# Patient Record
Sex: Female | Born: 1990 | Hispanic: No | Marital: Single | Smoking: Never smoker
Health system: Southern US, Community
[De-identification: ages and names within clinical notes are randomized; demographics above are authoritative.]

## PROBLEM LIST (undated history)

## (undated) ENCOUNTER — Inpatient Hospital Stay (HOSPITAL_COMMUNITY): Payer: Self-pay

## (undated) DIAGNOSIS — I82409 Acute embolism and thrombosis of unspecified deep veins of unspecified lower extremity: Secondary | ICD-10-CM

## (undated) HISTORY — DX: Acute embolism and thrombosis of unspecified deep veins of unspecified lower extremity: I82.409

---

## 2009-12-22 ENCOUNTER — Ambulatory Visit (HOSPITAL_COMMUNITY): Admission: RE | Admit: 2009-12-22 | Discharge: 2009-12-22 | Payer: Self-pay | Admitting: Obstetrics & Gynecology

## 2010-05-04 ENCOUNTER — Inpatient Hospital Stay (HOSPITAL_COMMUNITY)
Admission: AD | Admit: 2010-05-04 | Discharge: 2010-05-04 | Payer: Self-pay | Source: Home / Self Care | Admitting: Obstetrics & Gynecology

## 2010-05-04 ENCOUNTER — Ambulatory Visit: Payer: Self-pay | Admitting: Advanced Practice Midwife

## 2010-05-15 ENCOUNTER — Ambulatory Visit: Payer: Self-pay | Admitting: Obstetrics and Gynecology

## 2010-05-15 ENCOUNTER — Inpatient Hospital Stay (HOSPITAL_COMMUNITY)
Admission: AD | Admit: 2010-05-15 | Discharge: 2010-05-17 | Payer: Self-pay | Source: Home / Self Care | Admitting: Obstetrics and Gynecology

## 2011-03-19 LAB — CBC
Hemoglobin: 10.1 g/dL — ABNORMAL LOW (ref 12.0–15.0)
RBC: 3.78 MIL/uL — ABNORMAL LOW (ref 3.87–5.11)
WBC: 13.2 10*3/uL — ABNORMAL HIGH (ref 4.0–10.5)

## 2011-03-19 LAB — RPR: RPR Ser Ql: NONREACTIVE

## 2015-07-08 LAB — OB RESULTS CONSOLE HGB/HCT, BLOOD
HEMATOCRIT: 38 %
HEMOGLOBIN: 12.5 g/dL

## 2015-07-18 ENCOUNTER — Encounter: Payer: Self-pay | Admitting: *Deleted

## 2015-07-25 ENCOUNTER — Encounter: Payer: Self-pay | Admitting: Obstetrics and Gynecology

## 2015-07-25 ENCOUNTER — Other Ambulatory Visit (HOSPITAL_COMMUNITY)
Admission: RE | Admit: 2015-07-25 | Discharge: 2015-07-25 | Disposition: A | Discharge status: Home / Self Care | Payer: BLUE CROSS/BLUE SHIELD | Source: Ambulatory Visit | Attending: Obstetrics and Gynecology | Admitting: Obstetrics and Gynecology

## 2015-07-25 ENCOUNTER — Ambulatory Visit (INDEPENDENT_AMBULATORY_CARE_PROVIDER_SITE_OTHER): Payer: BLUE CROSS/BLUE SHIELD | Admitting: Obstetrics and Gynecology

## 2015-07-25 VITALS — BP 100/60 | Ht 67.5 in

## 2015-07-25 DIAGNOSIS — Z113 Encounter for screening for infections with a predominantly sexual mode of transmission: Secondary | ICD-10-CM | POA: Insufficient documentation

## 2015-07-25 DIAGNOSIS — Z01419 Encounter for gynecological examination (general) (routine) without abnormal findings: Secondary | ICD-10-CM | POA: Diagnosis not present

## 2015-07-25 MED ORDER — MEGESTROL ACETATE 40 MG PO TABS: 40.0000 mg | ORAL_TABLET | Freq: Three times a day (TID) | ORAL | Status: DC

## 2015-07-25 NOTE — Progress Notes (Signed)
Patient ID: Anne Zuercher, female   DOB: Sep 27, 1991, 24 y.o.   MRN: [id number]    [facility] Visit  Patient name: Anne Kirk MRN [id number]  Date of birth: 12-26-1991  CC & HPI:  Anne Kirk is a 24 y.o. female presenting today for annual physical exam and pap smear. She has no complaints. Pt is not on birth control. She plans to use Nexplanon for birth control.  Pt was also referred to GYN by [facility] for irregular vaginal bleeding that started 1 month ago. Pt was on the depo shot for 2 years, but ceased birth control 6-7 months prior to the onset of symptoms. She notes regular MP after she stopped depo and before the irregular bleeding occurred. Pt was seen by [facility] for the same and had normal UA and lab work.   ROS:  A complete 10 system review of systems was obtained and all systems are negative except as noted in the HPI and PMH.   Pertinent History Reviewed:   Reviewed. Medical        History reviewed. No pertinent past medical history.                            Surgical Hx:   History reviewed. No pertinent past surgical history. Medications: Reviewed & Updated - see associated section                      No current outpatient prescriptions on file.   Social History: Reviewed -  reports that she has never smoked. She has never used smokeless tobacco.  Objective:  BP 100/60 mmHg  Ht 5' 7.5" (1.715 m)  LMP 06/22/2015   BMI: There is no weight on file to calculate BMI.   Annual Physical Exam and Pap Smear  General Appearance: Alert, appropriate appearance for age. No acute distress HEENT: Grossly normal Neck / Thyroid:  Cardiovascular: RRR; normal S1, S2, no murmur Lungs: CTA bilaterally Back: No CVAT Breast Exam: No dimpling, nipple retraction or discharge. No masses or nodes., Normal to inspection, Normal breast tissue bilaterally and No masses or nodes.No dimpling, nipple retraction or  discharge. Gastrointestinal: Soft, non-tender, no masses or organomegaly Pelvic Exam: External genitalia: normal general appearance Vaginal: normal mucosa without prolapse or lesions and normal without tenderness, induration or masses Cervix: normal appearance Adnexa: normal bimanual exam Uterus: normal single, nontender and tiny Rectovaginal: not indicated Lymphatic Exam: Non-palpable nodes in neck, clavicular, axillary, or inguinal regions  Skin: no rash or abnormalities Neurologic: Normal gait and speech, no tremor  Psychiatric: Alert and oriented, appropriate affect.  12:23 PM Pap Smear Collected  Assessment & Plan:   A:  1. Normal physical exam; pap smear done 2. Irregular bleeding; suspect secondary to stopping Provera  P:  1. pap smear done, next pap due in 3 years 2. return annually or prn 3    Annual mammogram advised 4. Prescribe Megace 40 tid til stops5. Pt to return in 4 weeks for Nexplanon; discussed abstinence and/or contraceptive use for 2 weeks prior to insertion  This chart was scribed for Tilda Burrow, MD by Gwenyth Ober, ED Scribe. This patient was seen in room 1 and the patient's care was started at 12:12 PM.   I personally performed the services described in this documentation, which was SCRIBED in my presence. The recorded information has been reviewed and considered accurate. It has been edited as  necessary during review. Tilda Burrow, MD

## 2015-07-25 NOTE — Progress Notes (Signed)
Patient ID: Anne Kirk, female   DOB: [birth year], 24 y.o.   MRN: [id number] Pt here today for annual exam. Pt states that she has had some abnormal bleeding since 06/22/15. Pt has never had this before. And wants to discuss with Dr. Emelda Fear.

## 2015-07-26 LAB — CYTOLOGY - PAP

## 2015-08-24 ENCOUNTER — Encounter: Payer: BLUE CROSS/BLUE SHIELD | Admitting: Advanced Practice Midwife

## 2015-08-31 ENCOUNTER — Encounter: Payer: BLUE CROSS/BLUE SHIELD | Admitting: Advanced Practice Midwife

## 2015-08-31 ENCOUNTER — Encounter: Payer: Self-pay | Admitting: Advanced Practice Midwife

## 2020-12-14 ENCOUNTER — Other Ambulatory Visit: Payer: Self-pay

## 2020-12-14 ENCOUNTER — Encounter (HOSPITAL_COMMUNITY): Payer: Self-pay | Admitting: Emergency Medicine

## 2020-12-14 ENCOUNTER — Emergency Department (HOSPITAL_COMMUNITY): Payer: Commercial Managed Care - PPO

## 2020-12-14 ENCOUNTER — Emergency Department (HOSPITAL_COMMUNITY)
Admission: EM | Admit: 2020-12-14 | Discharge: 2020-12-14 | Disposition: A | Discharge status: Home / Self Care | Payer: Commercial Managed Care - PPO | Attending: Emergency Medicine | Admitting: Emergency Medicine

## 2020-12-14 DIAGNOSIS — R600 Localized edema: Secondary | ICD-10-CM

## 2020-12-14 DIAGNOSIS — R2232 Localized swelling, mass and lump, left upper limb: Secondary | ICD-10-CM | POA: Diagnosis present

## 2020-12-14 DIAGNOSIS — I82622 Acute embolism and thrombosis of deep veins of left upper extremity: Secondary | ICD-10-CM

## 2020-12-14 LAB — BASIC METABOLIC PANEL
Anion gap: 9 (ref 5–15)
BUN: 11 mg/dL (ref 6–20)
CO2: 25 mmol/L (ref 22–32)
Calcium: 9.2 mg/dL (ref 8.9–10.3)
Chloride: 101 mmol/L (ref 98–111)
Creatinine, Ser: 0.57 mg/dL (ref 0.44–1.00)
GFR, Estimated: >60 mL/min (ref >60)
Glucose, Bld: 98 mg/dL (ref 70–99)
Potassium: 3.6 mmol/L (ref 3.5–5.1)
Sodium: 135 mmol/L (ref 135–145)

## 2020-12-14 LAB — CBC WITH DIFFERENTIAL/PLATELET
Abs Immature Granulocytes: 0.05 10*3/uL (ref 0.00–0.07)
Basophils Absolute: 0.1 10*3/uL (ref 0.0–0.1)
Basophils Relative: 0 %
Eosinophils Absolute: 0.6 10*3/uL — ABNORMAL HIGH (ref 0.0–0.5)
Eosinophils Relative: 4 %
HCT: 36.7 % (ref 36.0–46.0)
Hemoglobin: 11.8 g/dL — ABNORMAL LOW (ref 12.0–15.0)
Immature Granulocytes: 0 %
Lymphocytes Relative: 19 %
Lymphs Abs: 3 10*3/uL (ref 0.7–4.0)
MCH: 27.6 pg (ref 26.0–34.0)
MCHC: 32.2 g/dL (ref 30.0–36.0)
MCV: 85.9 fL (ref 80.0–100.0)
Monocytes Absolute: 0.9 10*3/uL (ref 0.1–1.0)
Monocytes Relative: 6 %
Neutro Abs: 10.9 10*3/uL — ABNORMAL HIGH (ref 1.7–7.7)
Neutrophils Relative %: 71 %
Platelets: 496 10*3/uL — ABNORMAL HIGH (ref 150–400)
RBC: 4.27 MIL/uL (ref 3.87–5.11)
RDW: 12.3 % (ref 11.5–15.5)
WBC: 15.5 10*3/uL — ABNORMAL HIGH (ref 4.0–10.5)
nRBC: 0 % (ref 0.0–0.2)

## 2020-12-14 LAB — PROTIME-INR
INR: 1.1 (ref 0.8–1.2)
Prothrombin Time: 14 seconds (ref 11.4–15.2)

## 2020-12-14 LAB — POC URINE PREG, ED: Preg Test, Ur: NEGATIVE

## 2020-12-14 MED ORDER — RIVAROXABAN (XARELTO) VTE STARTER PACK (15 & 20 MG): ORAL_TABLET | ORAL | 0 refills | Status: DC

## 2020-12-14 MED ORDER — ACETAMINOPHEN 500 MG PO TABS
1000.0000 mg | ORAL_TABLET | Freq: Once | ORAL | Status: AC
  Administered 2020-12-14: 1000 mg via ORAL
  Filled 2020-12-14: qty 2

## 2020-12-14 NOTE — ED Provider Notes (Signed)
[facility] EMERGENCY DEPARTMENT Provider Note   CSN: [id number] Arrival date & time: 12/14/20  1024     History Chief Complaint  Patient presents with   Arm Injury    Anne Kirk is a 29 y.o. female.  Patient presents with persistent left arm pain upper and lower for the past week.  No injuries.  No history of similar, no history of blood clots.  Patient is on birth control.  No fevers chills or shortness of breath.  Patient has swelling to the left arm.        History reviewed. No pertinent past medical history.  There are no problems to display for this patient.   History reviewed. No pertinent surgical history.   OB History   No obstetric history on file.     Family History  Problem Relation Age of Onset   Hypertension Father    Cancer Maternal Grandmother    Diabetes Maternal Grandmother    Cancer Maternal Grandfather    Heart disease Maternal Grandfather    Asthma Son    Eczema Son    Hypertension Paternal Grandfather    Heart disease Paternal Grandfather     Social History   Tobacco Use   Smoking status: Never Smoker   Smokeless tobacco: Never Used  Building services engineer Use: Never used  Substance Use Topics   Alcohol use: No   Drug use: No    Home Medications Prior to Admission medications   Medication Sig Start Date End Date Taking? Authorizing Provider  megestrol (MEGACE) 40 MG tablet Take 1 tablet (40 mg total) by mouth 3 (three) times daily. Until bleeding stops then once taily til 7 days before next visit 07/25/15   Tilda Burrow, MD    Allergies    Patient has no known allergies.  Review of Systems   Review of Systems  Constitutional: Negative for chills and fever.  HENT: Negative for congestion.   Respiratory: Negative for shortness of breath.   Cardiovascular: Negative for chest pain.  Gastrointestinal: Negative for abdominal pain and vomiting.  Genitourinary: Negative for dysuria and flank pain.   Musculoskeletal: Negative for back pain, neck pain and neck stiffness.  Skin: Negative for rash.  Neurological: Negative for light-headedness and headaches.    Physical Exam Updated Vital Signs BP 130/70 (BP Location: Right Arm)    Pulse 100    Temp 98.2 F (36.8 C) (Oral)    Resp 16    Ht 5\' 8"  (1.727 m)    Wt 89.8 kg    LMP 12/02/2020 (Approximate)    SpO2 100%    BMI 30.11 kg/m   Physical Exam Vitals and nursing note reviewed.  Constitutional:      Appearance: She is well-developed and well-nourished.  HENT:     Head: Normocephalic and atraumatic.  Eyes:     General:        Right eye: No discharge.        Left eye: No discharge.     Conjunctiva/sclera: Conjunctivae normal.  Neck:     Trachea: No tracheal deviation.  Cardiovascular:     Rate and Rhythm: Normal rate.  Pulmonary:     Effort: Pulmonary effort is normal.  Abdominal:     General: There is no distension.     Palpations: Abdomen is soft.     Tenderness: There is no abdominal tenderness. There is no guarding.  Musculoskeletal:        General: Swelling and tenderness present.  No edema.     Cervical back: Normal range of motion and neck supple.  Skin:    General: Skin is warm.     Capillary Refill: Capillary refill takes less than 2 seconds.     Findings: No rash.     Comments: Patient has mild edema left upper and lower arm, compartments soft, no ecchymosis, mild dilated veins, neurovascularly intact distal left arm.  No external signs of infection.  No focal bony tenderness with range of motion of left shoulder elbow or wrist.  Neurological:     General: No focal deficit present.     Mental Status: She is alert and oriented to person, place, and time.  Psychiatric:        Mood and Affect: Mood and affect normal.     ED Results / Procedures / Treatments   Labs (all labs ordered are listed, but only abnormal results are displayed) Labs Reviewed  CBC WITH DIFFERENTIAL/PLATELET - Abnormal; Notable for the  following components:      Result Value   WBC 15.5 (*)    Hemoglobin 11.8 (*)    Platelets 496 (*)    Neutro Abs 10.9 (*)    Eosinophils Absolute 0.6 (*)    All other components within normal limits  BASIC METABOLIC PANEL  PROTIME-INR  POC URINE PREG, ED    EKG None  Radiology DG Forearm Left  Result Date: 12/14/2020 CLINICAL DATA:  Pain and soft tissue swelling EXAM: LEFT FOREARM - 2 VIEW COMPARISON:  None. FINDINGS: Frontal and lateral views were obtained. No fracture or dislocation. Joint spaces appear normal. No erosive change. There is a minus ulnar variance. IMPRESSION: No fracture or dislocation. No evident arthropathy. Minus ulnar variance. Electronically Signed   By: Bretta Bang III M.D.   On: 12/14/2020 11:45   US Venous Img Upper Left (DVT Study)  Result Date: 12/14/2020 CLINICAL DATA:  Left upper extremity swelling EXAM: LEFT UPPER EXTREMITY VENOUS DOPPLER ULTRASOUND TECHNIQUE: Gray-scale sonography with graded compression, as well as color Doppler and duplex ultrasound were performed to evaluate the upper extremity deep venous system from the level of the subclavian vein and including the jugular, axillary, basilic, radial, ulnar and upper cephalic vein. Spectral Doppler was utilized to evaluate flow at rest and with distal augmentation maneuvers. COMPARISON:  None. FINDINGS: Contralateral Subclavian Vein: Respiratory phasicity is normal and symmetric with the symptomatic side. No evidence of thrombus. Normal compressibility. Internal Jugular Vein: Concentric thrombus is noted with mild patency centrally. Subclavian Vein: Thrombus is identified with decreased compressibility. Axillary Vein: Thrombus is noted. Cephalic Vein: No evidence of thrombus. Normal compressibility, respiratory phasicity and response to augmentation. Basilic Vein: No evidence of thrombus. Normal compressibility, respiratory phasicity and response to augmentation. Brachial Veins: Thrombus is noted  with decreased compressibility. Radial Veins: No evidence of thrombus. Normal compressibility, respiratory phasicity and response to augmentation. Ulnar Veins: No evidence of thrombus. Normal compressibility, respiratory phasicity and response to augmentation. Venous Reflux:  None visualized. Other Findings:  None visualized. IMPRESSION: Diffuse left upper extremity deep venous thrombosis involving the internal jugular vein, subclavian, axillary and brachial veins. Electronically Signed   By: Alcide Clever M.D.   On: 12/14/2020 13:34   DG Humerus Left  Result Date: 12/14/2020 CLINICAL DATA:  Pain and swelling EXAM: LEFT HUMERUS - 2+ VIEW COMPARISON:  None. FINDINGS: Frontal and lateral views were obtained. No fracture or dislocation. No abnormal periosteal reaction. Joint spaces appear normal. No soft tissue air. IMPRESSION: No fracture or  dislocation.  No evident arthropathy. Electronically Signed   By: Bretta Bang III M.D.   On: 12/14/2020 11:46    Procedures Procedures (including critical care time)  Medications Ordered in ED Medications  acetaminophen (TYLENOL) tablet 1,000 mg (1,000 mg Oral Given 12/14/20 1228)    ED Course  I have reviewed the triage vital signs and the nursing notes.  Pertinent labs & imaging results that were available during my care of the patient were reviewed by me and considered in my medical decision making (see chart for details).    MDM Rules/Calculators/A&P                          Patient presents with left arm pain and swelling, x-rays were ordered and reviewed no acute fracture or dislocation.  With patient being on birth control discussed differential including DVT, ultrasound ordered. Discussed other differentials and importance of close outpatient follow-up.  Patient's ultrasound results reviewed showing extensive DVT extending from internal jugular vein and subclavian into the left arm. Blood work ordered, a urine pregnancy test pending.   Paged vascular surgery for further recommendations.  Discussed with Dr. Myra Gianotti, vascular surgery who worked with his office staff to get her an urgent appointment the end of this week.  We reviewed the results and discuss these with the patient.  Blood work added to help with outpatient follow-up and procedure planning.  Urine pregnancy negative.  Xarelto prescription ordered.  Strict reasons to return given and education regarding blood thinners and risk of pulmonary embolism discussed.  Blood work reviewed showing mild leukocytosis 15.8, mild anemia 11.  Urine pregnancy test negative.  Discussed absolutely no cigarette smoking/nicotine/OCPs.  Patient will follow up with primary doctor for further advice as well.  Final Clinical Impression(s) / ED Diagnoses Final diagnoses:  Edema of left upper arm    Rx / DC Orders ED Discharge Orders    None       Blane Ohara, MD 12/14/20 1519

## 2020-12-14 NOTE — Discharge Instructions (Addendum)
Stop taking birth control pills. Discuss other options for birth control with your doctor. No smoking cigarettes. Start taking blood thinner and see vascular surgery this week Return to see a clinician or call EMS if you develop significant shortness of breath, passout, head injury or new concerns.

## 2020-12-14 NOTE — ED Triage Notes (Signed)
Pt c/o left arm pain x1 week. Pt has obvious swelling to the left upper arm with no known injury.

## 2020-12-19 ENCOUNTER — Encounter: Payer: Self-pay | Admitting: Surgery

## 2020-12-19 ENCOUNTER — Other Ambulatory Visit: Payer: Self-pay

## 2020-12-19 ENCOUNTER — Ambulatory Visit (INDEPENDENT_AMBULATORY_CARE_PROVIDER_SITE_OTHER): Payer: Commercial Managed Care - PPO | Admitting: Surgery

## 2020-12-19 VITALS — BP 105/71 | HR 94 | Temp 98.2°F | Resp 20 | Ht 68.0 in | Wt 191.0 lb

## 2020-12-19 DIAGNOSIS — I82A12 Acute embolism and thrombosis of left axillary vein: Secondary | ICD-10-CM

## 2020-12-19 NOTE — Progress Notes (Signed)
 [facility] [place]  Patient name: Anne Kirk       MRN: [id number]        DOB: 09/01/1991            Sex: female   REQUESTING PROVIDER:    ER   REASON FOR CONSULT:    Left arm DVT  HISTORY OF PRESENT ILLNESS:   Anne Kirk is a 29 y.o. female, who presented to the [facility] emergency department on 12/14/2020 with a 1 week history of left arm pain and swelling.  She denied any history of trauma.  She had an ultrasound that was positive for left arm DVT.  She is a smoker and has been taking birth control medication, both of which she has recently stopped.  She was started on anticoagulation and discharged home.  She states today that her arm is slightly better but still remains swollen and painful particularly in the biceps region.  She denies any prior history of clotting disorders within her or her family.  PAST MEDICAL HISTORY        Past Medical History:  Diagnosis Date  . DVT (deep venous thrombosis) (HCC)      FAMILY HISTORY        Family History  Problem Relation Age of Onset  . Hypertension Father   . Cancer Maternal Grandmother   . Diabetes Maternal Grandmother   . Cancer Maternal Grandfather   . Heart disease Maternal Grandfather   . Asthma Son   . Eczema Son   . Hypertension Paternal Grandfather   . Heart disease Paternal Grandfather     SOCIAL HISTORY:   Social History   Socioeconomic History  . Marital status: Single    Spouse name: Not on file  . Number of children: Not on file  . Years of education: Not on file  . Highest education level: Not on file  Occupational History  . Not on file  Tobacco Use  . Smoking status: Never Smoker  . Smokeless tobacco: Never Used  Vaping Use  . Vaping Use: Never used  Substance and Sexual Activity  . Alcohol use: No  . Drug use: No  . Sexual activity: Yes    Birth control/protection: None, Condom   Other Topics Concern  . Not on file  Social History Narrative  . Not on file   Social Determinants of Health   Financial Resource Strain: Not on file  Food Insecurity: Not on file  Transportation Needs: Not on file  Physical Activity: Not on file  Stress: Not on file  Social Connections: Not on file  Intimate Partner Violence: Not on file    ALLERGIES:    No Known Allergies  CURRENT MEDICATIONS:          Current Outpatient Medications  Medication Sig Dispense Refill  . acetaminophen (TYLENOL) 325 MG tablet Take 650 mg by mouth every 6 (six) hours as needed.    . RIVAROXABAN (XARELTO) VTE STARTER PACK (15 & 20 MG) Follow package directions: Take one 15mg tablet by mouth twice a day. On day 22, switch to one 20mg tablet once a day. Take with food. 51 each 0   No current facility-administered medications for this visit.    REVIEW OF SYSTEMS:   [X] denotes positive finding, [ ] denotes negative finding Cardiac  Comments:  Chest pain or chest pressure:    Shortness of breath upon exertion:    Short of breath when lying flat:      Irregular heart rhythm:        Vascular    Pain in calf, thigh, or hip brought on by ambulation:    Pain in feet at night that wakes you up from your sleep:     Blood clot in your veins: x   Leg swelling:         Pulmonary    Oxygen at home:    Productive cough:     Wheezing:         Neurologic    Sudden weakness in arms or legs:     Sudden numbness in arms or legs:     Sudden onset of difficulty speaking or slurred speech:    Temporary loss of vision in one eye:     Problems with dizziness:         Gastrointestinal    Blood in stool:      Vomited blood:         Genitourinary    Burning when urinating:     Blood in urine:        Psychiatric    Major depression:         Hematologic    Bleeding problems:    Problems with blood  clotting too easily:        Skin    Rashes or ulcers:        Constitutional    Fever or chills:     PHYSICAL EXAM:      Vitals:   12/19/20 1510  BP: 105/71  Pulse: 94  Resp: 20  Temp: 98.2 F (36.8 C)  SpO2: 97%  Weight: 191 lb (86.6 kg)  Height: 5' 8" (1.727 m)    GENERAL: The patient is a well-nourished female, in no acute distress. The vital signs are documented above. CARDIAC: There is a regular rate and rhythm.  VASCULAR: I evaluated her arm with ultrasound.  She appears to have a patent brachial veins above the antecubital crease but clot up near the axilla.  The arm is moderately swollen and tender. PULMONARY: Nonlabored respirations MUSCULOSKELETAL: There are no major deformities or cyanosis. NEUROLOGIC: No focal weakness or paresthesias are detected. SKIN: There are no ulcers or rashes noted. PSYCHIATRIC: The patient has a normal affect.  STUDIES:   I have reviewed the following :  Diffuse left upper extremity deep venous thrombosis involving the internal jugular vein, subclavian, axillary and brachial veins.  ASSESSMENT and PLAN   Left arm DVT: I discussed with the patient that this is most likely secondary to her smoking and birth control pills, both of which she has discontinued.  Therefore I think she will require only a 3-month course of anticoagulation.  She is still having trouble with swelling and pain in her left arm.  I think we still have a window of time where we can improve upon this.  We discussed proceeding with left arm venogram through brachial vein approach above the antecubital crease and intervening as indicated.  I told her that I may end up putting a lysis catheter in for 6 hours of lysis before bringing her back for mechanical thrombectomy.  All of her questions were answered.  This is been scheduled for Wednesday, December 22.   Wells Lukus Binion, IV, MD, FACS [facility] [place] Tel  [phone] Pager [phone]  

## 2020-12-20 ENCOUNTER — Other Ambulatory Visit
Admission: RE | Admit: 2020-12-20 | Discharge: 2020-12-20 | Disposition: A | Discharge status: Home / Self Care | Payer: Commercial Managed Care - PPO | Source: Ambulatory Visit | Attending: Surgery | Admitting: Surgery

## 2020-12-20 DIAGNOSIS — Z20822 Contact with and (suspected) exposure to covid-19: Secondary | ICD-10-CM | POA: Diagnosis not present

## 2020-12-20 DIAGNOSIS — Z01812 Encounter for preprocedural laboratory examination: Secondary | ICD-10-CM | POA: Diagnosis present

## 2020-12-20 LAB — SARS CORONAVIRUS 2 (TAT 6-24 HRS): SARS Coronavirus 2: NEGATIVE

## 2020-12-21 ENCOUNTER — Ambulatory Visit (HOSPITAL_COMMUNITY)
Admission: RE | Admit: 2020-12-21 | Discharge: 2020-12-21 | Disposition: A | Discharge status: Home / Self Care | Payer: Commercial Managed Care - PPO | Attending: Surgery | Admitting: Surgery

## 2020-12-21 ENCOUNTER — Other Ambulatory Visit: Payer: Self-pay

## 2020-12-21 ENCOUNTER — Encounter (HOSPITAL_COMMUNITY)
Admission: RE | Disposition: A | Discharge status: Home / Self Care | Payer: Self-pay | Source: Home / Self Care | Attending: Surgery

## 2020-12-21 ENCOUNTER — Encounter (HOSPITAL_COMMUNITY): Payer: Self-pay | Admitting: Surgery

## 2020-12-21 ENCOUNTER — Ambulatory Visit (HOSPITAL_COMMUNITY): Admit: 2020-12-21 | Payer: Commercial Managed Care - PPO | Admitting: Surgery

## 2020-12-21 DIAGNOSIS — I82622 Acute embolism and thrombosis of deep veins of left upper extremity: Secondary | ICD-10-CM | POA: Insufficient documentation

## 2020-12-21 DIAGNOSIS — Z87891 Personal history of nicotine dependence: Secondary | ICD-10-CM | POA: Insufficient documentation

## 2020-12-21 DIAGNOSIS — Z7901 Long term (current) use of anticoagulants: Secondary | ICD-10-CM | POA: Diagnosis not present

## 2020-12-21 HISTORY — PX: UPPER EXTREMITY VENOGRAPHY: CATH118272

## 2020-12-21 HISTORY — PX: PERIPHERAL VASCULAR THROMBECTOMY: CATH118306

## 2020-12-21 LAB — POCT I-STAT, CHEM 8
BUN: 6 mg/dL (ref 6–20)
Calcium, Ion: 1.29 mmol/L (ref 1.15–1.40)
Chloride: 105 mmol/L (ref 98–111)
Creatinine, Ser: 0.7 mg/dL (ref 0.44–1.00)
Glucose, Bld: 99 mg/dL (ref 70–99)
HCT: 37 % (ref 36.0–46.0)
Hemoglobin: 12.6 g/dL (ref 12.0–15.0)
Potassium: 4 mmol/L (ref 3.5–5.1)
Sodium: 138 mmol/L (ref 135–145)
TCO2: 24 mmol/L (ref 22–32)

## 2020-12-21 LAB — PREGNANCY, URINE: Preg Test, Ur: NEGATIVE

## 2020-12-21 LAB — POCT ACTIVATED CLOTTING TIME: Activated Clotting Time: 237 seconds

## 2020-12-21 SURGERY — UPPER EXTREMITY VENOGRAPHY
Anesthesia: LOCAL | Laterality: Left

## 2020-12-21 SURGERY — PERIPHERAL VASCULAR THROMBECTOMY
Anesthesia: LOCAL

## 2020-12-21 MED ORDER — FENTANYL CITRATE (PF) 100 MCG/2ML IJ SOLN
INTRAMUSCULAR | Status: AC
  Filled 2020-12-21: qty 2

## 2020-12-21 MED ORDER — HEPARIN (PORCINE) IN NACL 1000-0.9 UT/500ML-% IV SOLN
INTRAVENOUS | Status: DC | PRN
  Administered 2020-12-21: 500 mL

## 2020-12-21 MED ORDER — MIDAZOLAM HCL 2 MG/2ML IJ SOLN
INTRAMUSCULAR | Status: AC
  Filled 2020-12-21: qty 2

## 2020-12-21 MED ORDER — LIDOCAINE HCL (PF) 1 % IJ SOLN
INTRAMUSCULAR | Status: AC
  Filled 2020-12-21: qty 30

## 2020-12-21 MED ORDER — SODIUM CHLORIDE 0.9 % IV SOLN
1.0000 mg/h | INTRAVENOUS | Status: DC
  Administered 2020-12-21: 12:00:00 1 mg/h
  Filled 2020-12-21 (×3): qty 10

## 2020-12-21 MED ORDER — IODIXANOL 320 MG/ML IV SOLN
INTRAVENOUS | Status: DC | PRN
  Administered 2020-12-21: 17:00:00 30 mL via INTRAVENOUS

## 2020-12-21 MED ORDER — LIDOCAINE HCL (PF) 1 % IJ SOLN
INTRAMUSCULAR | Status: DC | PRN
  Administered 2020-12-21: 5 mL via SUBCUTANEOUS

## 2020-12-21 MED ORDER — SODIUM CHLORIDE 0.9 % IV SOLN: INTRAVENOUS | Status: DC

## 2020-12-21 MED ORDER — HEPARIN SODIUM (PORCINE) 1000 UNIT/ML IJ SOLN
INTRAMUSCULAR | Status: DC | PRN
  Administered 2020-12-21: 5000 [IU] via INTRAVENOUS

## 2020-12-21 MED ORDER — LIDOCAINE HCL (PF) 1 % IJ SOLN
INTRAMUSCULAR | Status: DC | PRN
  Administered 2020-12-21: 5 mL via INTRADERMAL

## 2020-12-21 MED ORDER — HEPARIN SODIUM (PORCINE) 1000 UNIT/ML IJ SOLN
INTRAMUSCULAR | Status: AC
  Filled 2020-12-21: qty 1

## 2020-12-21 MED ORDER — FENTANYL CITRATE (PF) 100 MCG/2ML IJ SOLN
INTRAMUSCULAR | Status: DC | PRN
  Administered 2020-12-21 (×3): 50 ug via INTRAVENOUS

## 2020-12-21 MED ORDER — IODIXANOL 320 MG/ML IV SOLN
INTRAVENOUS | Status: DC | PRN
  Administered 2020-12-21: 12:00:00 20 mL via INTRAVENOUS

## 2020-12-21 MED ORDER — HEPARIN (PORCINE) 25000 UT/250ML-% IV SOLN
800.0000 [IU]/h | INTRAVENOUS | Status: DC
  Administered 2020-12-21: 13:00:00 800 [IU]/h via INTRAVENOUS
  Filled 2020-12-21: qty 250

## 2020-12-21 MED ORDER — HEPARIN (PORCINE) IN NACL 1000-0.9 UT/500ML-% IV SOLN
INTRAVENOUS | Status: AC
  Filled 2020-12-21: qty 500

## 2020-12-21 MED ORDER — MIDAZOLAM HCL 2 MG/2ML IJ SOLN
INTRAMUSCULAR | Status: DC | PRN
  Administered 2020-12-21: 1 mg via INTRAVENOUS

## 2020-12-21 MED ORDER — FENTANYL CITRATE (PF) 100 MCG/2ML IJ SOLN
INTRAMUSCULAR | Status: DC | PRN
  Administered 2020-12-21: 50 ug via INTRAVENOUS

## 2020-12-21 MED ORDER — ALTEPLASE 2 MG IJ SOLR
10.0000 mg | Freq: Once | INTRAMUSCULAR | Status: AC
  Administered 2020-12-21: 12:00:00 10 mg
  Filled 2020-12-21: qty 10

## 2020-12-21 SURGICAL SUPPLY — 14 items
BALLN MUSTANG 10X80X75 (BALLOONS) ×2
BALLN MUSTANG 8X80X75 (BALLOONS) ×2
BALLOON MUSTANG 10X80X75 (BALLOONS) ×1 IMPLANT
BALLOON MUSTANG 8X80X75 (BALLOONS) ×1 IMPLANT
CANISTER PENUMBRA ENGINE (MISCELLANEOUS) ×2 IMPLANT
CATH INDIGO D 50CM (CATHETERS) ×2 IMPLANT
CATH LIGHTNING 7 XTORQ 130 (CATHETERS) ×2 IMPLANT
COVER DOME SNAP 22 D (MISCELLANEOUS) ×2 IMPLANT
KIT ENCORE 26 ADVANTAGE (KITS) ×2 IMPLANT
PROTECTION STATION PRESSURIZED (MISCELLANEOUS) ×2
SHEATH PINNACLE 8F 10CM (SHEATH) ×2 IMPLANT
STATION PROTECTION PRESSURIZED (MISCELLANEOUS) ×1 IMPLANT
TRAY PV CATH (CUSTOM PROCEDURE TRAY) ×2 IMPLANT
WIRE HI TORQ VERSACORE J 260CM (WIRE) ×2 IMPLANT

## 2020-12-21 SURGICAL SUPPLY — 11 items
CATH ANGIO 5F BER2 65CM (CATHETERS) ×2 IMPLANT
CATH INFUS 135CMX30CM (CATHETERS) ×2 IMPLANT
COVER DOME SNAP 22 D (MISCELLANEOUS) ×2 IMPLANT
GLIDEWIRE ADV .035X180CM (WIRE) ×2 IMPLANT
KIT MICROPUNCTURE NIT STIFF (SHEATH) ×2 IMPLANT
PROTECTION STATION PRESSURIZED (MISCELLANEOUS) ×2
SHEATH PINNACLE 6F 10CM (SHEATH) ×2 IMPLANT
SHEATH PROBE COVER 6X72 (BAG) ×2 IMPLANT
STATION PROTECTION PRESSURIZED (MISCELLANEOUS) ×1 IMPLANT
TRAY PV CATH (CUSTOM PROCEDURE TRAY) ×2 IMPLANT
WIRE BENTSON .035X145CM (WIRE) ×2 IMPLANT

## 2020-12-21 NOTE — Op Note (Signed)
    Patient name: Anne Kirk MRN: [id number] DOB: 03-21-1991 Sex: female  12/21/2020 Pre-operative Diagnosis: Follow-up lysis Post-operative diagnosis:  Same Surgeon:  Durene Cal Procedure Performed:  1.  Follow-up venous thrombolytic study  2.  Central venogram  3.  Mechanical thrombectomy of the left innominate, left subclavian, left axillary, and left brachial vein  4.  Balloon venoplasty of the left innominate, left subclavian, left axillary, and left brachial vein  5.  Conscious sedation, 48 minutes     Indications: This is a 29 year old female with a left arm DVT.  Earlier today she had placement of a thrombolytic catheter and has been getting a TPA infusion.  She is back for follow-up.  Procedure:  The patient was identified in the holding area and taken to room 8.  The patient was then placed supine on the table and prepped and draped in the usual sterile fashion.  A time out was called.  Conscious sedation was administered with the use of IV fentanyl and Versed under continuous physician and nurse monitoring.  Heart rate, blood pressure, and oxygen saturation were continuously monitored.  Total sedation time was 48 minutes.  Over a 035 wire, the infusion catheter was removed.  A 8 French sheath was then placed.  Venogram was performed that showed persistent occlusion of the central venous system.  Intervention: The patient was given additional bolus of heparin.  ACT was checked and found to be greater than 250.  I used the penumbra CAT D device and perform mechanical thrombectomy of the left brachial, axillary, subclavian, and innominate vein.  3 passes were made with the device and then a contrast injection was performed which now showed a persistent flow channel through the central venous system.  An additional pass of the The CAT D device was then performed.  Another contrast injection was was performed.  I did not see any additional thrombus.  The vein was small in caliber.   I therefore elected to perform balloon venoplasty.  A 8 x 80 Mustang balloon was used to perform venoplasty of the left innominate, subclavian, axillary, and brachial vein.  An additional contrast injection was performed which showed an improved lumen.  There were still several areas of stenosis and so I upsized to a 10 x 80 balloon and repeated balloon venoplasty.  A final contrast injection was performed which showed a widely patent central venous system.  I elected to stop at this time.  The sheath was removed and manual pressure was held and the arm was then wrapped with a compression dressing.  There were no immediate complications  Impression:  #1  Successful mechanical thrombectomy and subsequent venoplasty of the left innominate, subclavian, axillary, and brachial veins using the penumbra CAT D device and a 10 mm balloon.  At the completion there was a patent central venous system.  #2  The patient will be discharged today.  She will resume her anticoagulation.  This will be continued for 3 months.     Juleen China, M.D., [facility] [place] Office: [phone] Pager:  [phone]

## 2020-12-21 NOTE — Progress Notes (Signed)
ANTICOAGULATION CONSULT NOTE - Initial Consult  Pharmacy Consult for heparin Indication: DVT  No Known Allergies  Patient Measurements: Height: 5\' 8"  (172.7 cm) Weight: 86.8 kg (191 lb 4.8 oz) IBW/kg (Calculated) : 63.9 Heparin Dosing Weight: 86kg  Vital Signs: Temp: 97.8 F (36.6 C) (12/22 0759) Temp Source: Oral (12/22 0759) BP: 132/86 (12/22 1203) Pulse Rate: 81 (12/22 1203)  Labs: Recent Labs    12/21/20 0828  HGB 12.6  HCT 37.0  CREATININE 0.70    Estimated Creatinine Clearance: 118.7 mL/min (by C-G formula based on SCr of 0.7 mg/dL).   Medical History: Past Medical History:  Diagnosis Date  . DVT (deep venous thrombosis) (HCC)      Assessment: 29 yoF with recent DVT 12/14/20 on Xarelto PTA (last dose 12/21 pm) admitted for PV intervention. Lysis catheter placed with tPA infusing, planning to run heparin at 800 units/h for now and perform relook cath this afternoon after 6h of alteplase.  Goal of Therapy:  Heparin level 0.2-0.5 units/ml aPTT 55-85 seconds Monitor platelets by anticoagulation protocol: Yes   Plan:  -Alteplase 1mg /h per MD -Heparin 800 units/h -Follow-up plans post relook angio - did not order heparin level/aPTT monitoring for now   1/22, PharmD, BCPS, [facility] Clinical Pharmacist [phone] Please check AMION for all [facility] Pharmacy numbers 12/21/2020

## 2020-12-21 NOTE — H&P (Signed)
Vascular and Vein Specialist of [facility]  Patient name: Anne Kirk       MRN: [id number]        DOB: 12/13/1991            Sex: female   REQUESTING PROVIDER:    ER   REASON FOR CONSULT:    Left arm DVT  HISTORY OF PRESENT ILLNESS:   Anne Kapur is a 29 y.o. female, who presented to the [facility] emergency department on 12/14/2020 with a 1 week history of left arm pain and swelling.  She denied any history of trauma.  She had an ultrasound that was positive for left arm DVT.  She is a smoker and has been taking birth control medication, both of which she has recently stopped.  She was started on anticoagulation and discharged home.  She states today that her arm is slightly better but still remains swollen and painful particularly in the biceps region.  She denies any prior history of clotting disorders within her or her family.  PAST MEDICAL HISTORY        Past Medical History:  Diagnosis Date  . DVT (deep venous thrombosis) (HCC)      FAMILY HISTORY        Family History  Problem Relation Age of Onset  . Hypertension Father   . Cancer Maternal Grandmother   . Diabetes Maternal Grandmother   . Cancer Maternal Grandfather   . Heart disease Maternal Grandfather   . Asthma Son   . Eczema Son   . Hypertension Paternal Grandfather   . Heart disease Paternal Grandfather     SOCIAL HISTORY:   Social History   Socioeconomic History  . Marital status: Single    Spouse name: Not on file  . Number of children: Not on file  . Years of education: Not on file  . Highest education level: Not on file  Occupational History  . Not on file  Tobacco Use  . Smoking status: Never Smoker  . Smokeless tobacco: Never Used  Vaping Use  . Vaping Use: Never used  Substance and Sexual Activity  . Alcohol use: No  . Drug use: No  . Sexual activity: Yes    Birth control/protection: None, Condom   Other Topics Concern  . Not on file  Social History Narrative  . Not on file   Social Determinants of Health   Financial Resource Strain: Not on file  Food Insecurity: Not on file  Transportation Needs: Not on file  Physical Activity: Not on file  Stress: Not on file  Social Connections: Not on file  Intimate Partner Violence: Not on file    ALLERGIES:    No Known Allergies  CURRENT MEDICATIONS:          Current Outpatient Medications  Medication Sig Dispense Refill  . acetaminophen (TYLENOL) 325 MG tablet Take 650 mg by mouth every 6 (six) hours as needed.    Marland Kitchen RIVAROXABAN (XARELTO) VTE STARTER PACK (15 & 20 MG) Follow package directions: Take one 15mg  tablet by mouth twice a day. On day 22, switch to one 20mg  tablet once a day. Take with food. 51 each 0   No current facility-administered medications for this visit.    REVIEW OF SYSTEMS:   [X]  denotes positive finding, [ ]  denotes negative finding Cardiac  Comments:  Chest pain or chest pressure:    Shortness of breath upon exertion:    Short of breath when lying flat:  Irregular heart rhythm:        Vascular    Pain in calf, thigh, or hip brought on by ambulation:    Pain in feet at night that wakes you up from your sleep:     Blood clot in your veins: x   Leg swelling:         Pulmonary    Oxygen at home:    Productive cough:     Wheezing:         Neurologic    Sudden weakness in arms or legs:     Sudden numbness in arms or legs:     Sudden onset of difficulty speaking or slurred speech:    Temporary loss of vision in one eye:     Problems with dizziness:         Gastrointestinal    Blood in stool:      Vomited blood:         Genitourinary    Burning when urinating:     Blood in urine:        Psychiatric    Major depression:         Hematologic    Bleeding problems:    Problems with blood  clotting too easily:        Skin    Rashes or ulcers:        Constitutional    Fever or chills:     PHYSICAL EXAM:      Vitals:   12/19/20 1510  BP: 105/71  Pulse: 94  Resp: 20  Temp: 98.2 F (36.8 C)  SpO2: 97%  Weight: 191 lb (86.6 kg)  Height: 5\' 8"  (1.727 m)    GENERAL: The patient is a well-nourished female, in no acute distress. The vital signs are documented above. CARDIAC: There is a regular rate and rhythm.  VASCULAR: I evaluated her arm with ultrasound.  She appears to have a patent brachial veins above the antecubital crease but clot up near the axilla.  The arm is moderately swollen and tender. PULMONARY: Nonlabored respirations MUSCULOSKELETAL: There are no major deformities or cyanosis. NEUROLOGIC: No focal weakness or paresthesias are detected. SKIN: There are no ulcers or rashes noted. PSYCHIATRIC: The patient has a normal affect.  STUDIES:   I have reviewed the following :  Diffuse left upper extremity deep venous thrombosis involving the internal jugular vein, subclavian, axillary and brachial veins.  ASSESSMENT and PLAN   Left arm DVT: I discussed with the patient that this is most likely secondary to her smoking and birth control pills, both of which she has discontinued.  Therefore I think she will require only a 70-month course of anticoagulation.  She is still having trouble with swelling and pain in her left arm.  I think we still have a window of time where we can improve upon this.  We discussed proceeding with left arm venogram through brachial vein approach above the antecubital crease and intervening as indicated.  I told her that I may end up putting a lysis catheter in for 6 hours of lysis before bringing her back for mechanical thrombectomy.  All of her questions were answered.  This is been scheduled for Wednesday, December 22.   09-03-1985, MD, FACS [facility] and Vein Specialists of [facility]  [phone] Pager [phone]

## 2020-12-21 NOTE — Discharge Instructions (Signed)
Fistulogram, Care After This sheet gives you information about how to care for yourself after your procedure. Your health care provider may also give you more specific instructions. If you have problems or questions, contact your health care provider. What can I expect after the procedure? After the procedure, it is common to have:  A small amount of discomfort in the area where the small, thin tube (catheter) was placed for the procedure.  A small amount of bruising around the fistula.  Sleepiness and tiredness (fatigue). Follow these instructions at home: Activity   Rest at home and do not lift anything that is heavier than 5 lb (2.3 kg) on the day after your procedure.  Return to your normal activities as told by your health care provider. Ask your health care provider what activities are safe for you.  Do not drive or use heavy machinery while taking prescription pain medicine.  Do not drive for 24 hours if you were given a medicine to help you relax (sedative) during your procedure. Medicines   Take over-the-counter and prescription medicines only as told by your health care provider. Puncture site care  Follow instructions from your health care provider about how to take care of the site where catheters were inserted. Make sure you: ? Wash your hands with soap and water before you change your bandage (dressing). If soap and water are not available, use hand sanitizer. ? Change your dressing as told by your health care provider. ? Leave stitches (sutures), skin glue, or adhesive strips in place. These skin closures may need to stay in place for 2 weeks or longer. If adhesive strip edges start to loosen and curl up, you may trim the loose edges. Do not remove adhesive strips completely unless your health care provider tells you to do that.  Check your puncture area every day for signs of infection. Check for: ? Redness, swelling, or pain. ? Fluid or blood. ? Warmth. ? Pus or  a bad smell. General instructions  Do not take baths, swim, or use a hot tub until your health care provider approves. Ask your health care provider if you may take showers. You may only be allowed to take sponge baths.  Monitor your dialysis fistula closely. Check to make sure that you can feel a vibration or buzz (a thrill) when you put your fingers over the fistula.  Prevent damage to your graft or fistula: ? Do not wear tight-fitting clothing or jewelry on the arm or leg that has your graft or fistula. ? Tell all your health care providers that you have a dialysis fistula or graft. ? Do not allow blood draws, IVs, or blood pressure readings to be done in the arm that has your fistula or graft. ? Do not allow flu shots or vaccinations in the arm with your fistula or graft.  Keep all follow-up visits as told by your health care provider. This is important. Contact a health care provider if:  You have redness, swelling, or pain at the site where the catheter was put in.  You have fluid or blood coming from the catheter site.  The catheter site feels warm to the touch.  You have pus or a bad smell coming from the catheter site.  You have a fever or chills. Get help right away if:  You feel weak.  You have trouble balancing.  You have trouble moving your arms or legs.  You have problems with your speech or vision.  You can  no longer feel a vibration or buzz when you put your fingers over your dialysis fistula.  The limb that was used for the procedure: ? Swells. ? Is painful. ? Is cold. ? Is discolored, such as blue or pale white.  You have chest pain or shortness of breath. Summary  After a dialysis fistulogram, it is common to have a small amount of discomfort or bruising in the area where the small, thin tube (catheter) was placed.  Rest at home on the day after your procedure. Return to your normal activities as told by your health care provider.  Take  over-the-counter and prescription medicines only as told by your health care provider.  Follow instructions from your health care provider about how to take care of the site where the catheter was inserted.  Keep all follow-up visits as told by your health care provider. This information is not intended to replace advice given to you by your health care provider. Make sure you discuss any questions you have with your health care provider. Document Revised: 01/17/2018 Document Reviewed: 01/17/2018 Elsevier Patient Education  2020 [employer].

## 2020-12-21 NOTE — Op Note (Signed)
    Patient name: Anne Kirk MRN: [id number] DOB: [birth year] Sex: female  12/21/2020 Pre-operative Diagnosis: Left arm DVT Post-operative diagnosis:  Same Surgeon:  Durene Cal Procedure Performed:  1. Ultrasound-guided access, left brachial vein  2. Central venogram  3. Insertion of 30 cm infusion catheter with intravenous administration of TPA  4. Conscious sedation 30 minutes     Indications: This is a 29 year old female who developed a left upper extremity and central DVT with significant arm swelling. This was felt to be secondary to birth control pills. She comes in today for evaluation and intervention.  Procedure:  The patient was identified in the holding area and taken to room 8.  The patient was then placed supine on the table and prepped and draped in the usual sterile fashion.  A time out was called.  Conscious sedation was administered with the use of IV fentanyl and Versed under continuous physician and nurse monitoring.  Heart rate, blood pressure, and oxygen saturations were continuously monitored.  Total sedation time was 30 minutes ultrasound was used to evaluate the brachial vein in the left arm which was widely patent and easily compressible. It was cannulated under ultrasound guidance with a micropuncture needle. A 018 wire was advanced not resistance and a micropuncture sheath was placed. Contrast injections were then performed.  Findings: The accessed left brachial vein was patent up to the axilla and then it occluded. No filling of the central venous structures were visualized. Multiple collaterals were seen.   Intervention: At this point I decided to intervene. A Glidewire advantage and a Berenstein 2 catheter were used to successfully cross the occlusion. The wire was easily navigated into the inferior vena cava. I then selected a 30 cm UniFuse infusion catheter and did 10 mg of pulse spray followed by continuous TPA infusion.  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  #3 The patient be brought back later this evening for further evaluation and treatment.    Juleen China, M.D., [facility] Vascular and Vein Specialists of [place] Office: [phone] Pager:  [phone]

## 2020-12-22 ENCOUNTER — Encounter (HOSPITAL_COMMUNITY): Payer: Self-pay | Admitting: Surgery

## 2020-12-29 ENCOUNTER — Other Ambulatory Visit: Payer: Self-pay

## 2020-12-29 DIAGNOSIS — Z6832 Body mass index (BMI) 32.0-32.9, adult: Secondary | ICD-10-CM | POA: Insufficient documentation

## 2020-12-29 DIAGNOSIS — Z30011 Encounter for initial prescription of contraceptive pills: Secondary | ICD-10-CM | POA: Insufficient documentation

## 2020-12-29 DIAGNOSIS — Z3042 Encounter for surveillance of injectable contraceptive: Secondary | ICD-10-CM | POA: Insufficient documentation

## 2020-12-29 DIAGNOSIS — I82A12 Acute embolism and thrombosis of left axillary vein: Secondary | ICD-10-CM

## 2021-01-16 ENCOUNTER — Encounter (HOSPITAL_COMMUNITY): Payer: Commercial Managed Care - PPO

## 2021-01-16 ENCOUNTER — Encounter: Payer: Commercial Managed Care - PPO | Admitting: Surgery

## 2021-01-20 ENCOUNTER — Other Ambulatory Visit: Payer: Self-pay

## 2021-01-20 DIAGNOSIS — I82A12 Acute embolism and thrombosis of left axillary vein: Secondary | ICD-10-CM

## 2021-01-20 MED ORDER — RIVAROXABAN 20 MG PO TABS: 20.0000 mg | ORAL_TABLET | Freq: Every day | ORAL | 6 refills | Status: AC

## 2021-03-20 ENCOUNTER — Ambulatory Visit (HOSPITAL_COMMUNITY)
Admission: RE | Admit: 2021-03-20 | Discharge: 2021-03-20 | Disposition: A | Discharge status: Home / Self Care | Payer: 59 | Source: Ambulatory Visit | Attending: Surgery | Admitting: Surgery

## 2021-03-20 ENCOUNTER — Ambulatory Visit (INDEPENDENT_AMBULATORY_CARE_PROVIDER_SITE_OTHER): Payer: 59 | Admitting: Surgery

## 2021-03-20 ENCOUNTER — Encounter: Payer: Self-pay | Admitting: Surgery

## 2021-03-20 ENCOUNTER — Other Ambulatory Visit: Payer: Self-pay

## 2021-03-20 VITALS — BP 118/78 | HR 66 | Temp 98.1°F | Resp 20 | Ht 68.0 in | Wt 213.0 lb

## 2021-03-20 DIAGNOSIS — I82A12 Acute embolism and thrombosis of left axillary vein: Secondary | ICD-10-CM | POA: Diagnosis present

## 2021-03-20 NOTE — Progress Notes (Signed)
Vascular and Vein Specialist of [facility]  Patient name: Anne Kirk MRN: [id number] DOB: 1991/06/12 Sex: female   REASON FOR VISIT:    Follow up DVT  HISOTRY OF PRESENT ILLNESS:    Anne Schulenburg is a 30 y.o. female, who presented to the [facility] emergency department on 12/14/2020 with a 1 week history of left arm pain and swelling.  She denied any history of trauma.  She had an ultrasound that was positive for left arm DVT.  She is a smoker and has been taking birth control medication, both of which she has recently stopped.  She was started on anticoagulation and discharged home.  She denies any prior history of clotting disorders within her or her family.  I ended up deciding to take her to the Cath Lab for lytic therapy.  I accessed the left brachial vein which was patent up to the axilla where it occluded there were multiple collaterals.  I then crossed the occlusion and placed a UniFuse catheter.  She received TPA for several hours and then I return for follow-up study.  I used the CAT D device as well as a 10 mm balloon.  At the end of the procedure the central veins were widely patent.  She reports a significant improvement in her left arm symptoms however her upper arm is larger than the right side.  She does not get any swelling.  She has worn a compression sleeve.  She is no longer taking birth control   PAST MEDICAL HISTORY:   Past Medical History:  Diagnosis Date  . DVT (deep venous thrombosis) (HCC)      FAMILY HISTORY:   Family History  Problem Relation Age of Onset  . Hypertension Father   . Cancer Maternal Grandmother   . Diabetes Maternal Grandmother   . Cancer Maternal Grandfather   . Heart disease Maternal Grandfather   . Asthma Son   . Eczema Son   . Hypertension Paternal Grandfather   . Heart disease Paternal Grandfather     SOCIAL HISTORY:   Social History   Tobacco Use  . Smoking status: Never Smoker   . Smokeless tobacco: Never Used  Substance Use Topics  . Alcohol use: No     ALLERGIES:   No Known Allergies   CURRENT MEDICATIONS:   Current Outpatient Medications  Medication Sig Dispense Refill  . acetaminophen (TYLENOL) 325 MG tablet Take 650 mg by mouth every 6 (six) hours as needed.    . rivaroxaban (XARELTO) 20 MG TABS tablet Take 1 tablet (20 mg total) by mouth daily with supper. 30 tablet 6   No current facility-administered medications for this visit.    REVIEW OF SYSTEMS:   [X]  denotes positive finding, [ ]  denotes negative finding Cardiac  Comments:  Chest pain or chest pressure:    Shortness of breath upon exertion:    Short of breath when lying flat:    Irregular heart rhythm:        Vascular    Pain in calf, thigh, or hip brought on by ambulation:    Pain in feet at night that wakes you up from your sleep:     Blood clot in your veins:    Leg swelling:         Pulmonary    Oxygen at home:    Productive cough:     Wheezing:         Neurologic    Sudden weakness in arms or legs:  Sudden numbness in arms or legs:     Sudden onset of difficulty speaking or slurred speech:    Temporary loss of vision in one eye:     Problems with dizziness:         Gastrointestinal    Blood in stool:     Vomited blood:         Genitourinary    Burning when urinating:     Blood in urine:        Psychiatric    Major depression:         Hematologic    Bleeding problems:    Problems with blood clotting too easily:        Skin    Rashes or ulcers:        Constitutional    Fever or chills:      PHYSICAL EXAM:   Vitals:   03/20/21 1525  BP: 118/78  Pulse: 66  Resp: 20  Temp: 98.1 F (36.7 C)  SpO2: 99%  Weight: 213 lb (96.6 kg)  Height: 5\' 8"  (1.727 m)    GENERAL: The patient is a well-nourished female, in no acute distress. The vital signs are documented above. CARDIAC: There is a regular rate and rhythm.  VASCULAR: Left arm measures 35  cm, right arm measures 31 cm PULMONARY: Non-labored respirations MUSCULOSKELETAL: There are no major deformities or cyanosis. NEUROLOGIC: No focal weakness or paresthesias are detected. SKIN: There are no ulcers or rashes noted. PSYCHIATRIC: The patient has a normal affect.  STUDIES:   I have reviewed the following studies: No evidence of thrombosis in the subclavian.    Left:  No evidence of superficial vein thrombosis in the upper extremity.  Findings  consistent with age indeterminate, concentric deep vein thrombosis  involving the  left internal jugular vein. There is mild patency in the central internal  jugular vein. There is no evidence of deep vein thrombosis in the  remaining deep  veins of the left upper extremity.     MEDICAL ISSUES:   Left upper extremity DVT: This is most likely secondary to birth control.  The patient states that she may have even actually taken extra doses to avoid missing a dose.  She got a good result with mechanical thrombectomy.  She still has some swelling in her upper arm, as it measures 4 cm bigger than the right side.  She is going to work on getting a compression sleeve.  I am referring her to hematology to help determine the length of time for anticoagulation.  I will see her back in 6 months with repeat duplex.  At this time this does not appear to look like a venous thoracic outlet syndrome but certainly that needs to be considered if she has a repeat issue.    , MD, FACS Vascular and Vein Specialists of [facility] [phone] Pager [phone]

## 2021-03-22 ENCOUNTER — Other Ambulatory Visit: Payer: Self-pay

## 2021-03-22 DIAGNOSIS — I82A12 Acute embolism and thrombosis of left axillary vein: Secondary | ICD-10-CM

## 2021-03-31 ENCOUNTER — Inpatient Hospital Stay (HOSPITAL_COMMUNITY): Payer: 59 | Attending: Hematology and Oncology | Admitting: Hematology and Oncology

## 2021-03-31 ENCOUNTER — Other Ambulatory Visit: Payer: Self-pay

## 2021-03-31 ENCOUNTER — Encounter (HOSPITAL_COMMUNITY): Payer: Self-pay | Admitting: Hematology and Oncology

## 2021-03-31 ENCOUNTER — Inpatient Hospital Stay (HOSPITAL_COMMUNITY): Payer: 59

## 2021-03-31 VITALS — BP 110/66 | HR 71 | Temp 98.2°F | Resp 17 | Ht 69.0 in | Wt 216.4 lb

## 2021-03-31 DIAGNOSIS — Z8249 Family history of ischemic heart disease and other diseases of the circulatory system: Secondary | ICD-10-CM | POA: Diagnosis not present

## 2021-03-31 DIAGNOSIS — Z79899 Other long term (current) drug therapy: Secondary | ICD-10-CM | POA: Diagnosis not present

## 2021-03-31 DIAGNOSIS — M7989 Other specified soft tissue disorders: Secondary | ICD-10-CM | POA: Diagnosis not present

## 2021-03-31 DIAGNOSIS — Z7901 Long term (current) use of anticoagulants: Secondary | ICD-10-CM | POA: Diagnosis not present

## 2021-03-31 DIAGNOSIS — I82622 Acute embolism and thrombosis of deep veins of left upper extremity: Secondary | ICD-10-CM | POA: Insufficient documentation

## 2021-03-31 DIAGNOSIS — I82A12 Acute embolism and thrombosis of left axillary vein: Secondary | ICD-10-CM

## 2021-03-31 DIAGNOSIS — G540 Brachial plexus disorders: Secondary | ICD-10-CM | POA: Diagnosis present

## 2021-03-31 DIAGNOSIS — Z836 Family history of other diseases of the respiratory system: Secondary | ICD-10-CM | POA: Insufficient documentation

## 2021-03-31 DIAGNOSIS — Z809 Family history of malignant neoplasm, unspecified: Secondary | ICD-10-CM | POA: Insufficient documentation

## 2021-03-31 DIAGNOSIS — Z793 Long term (current) use of hormonal contraceptives: Secondary | ICD-10-CM | POA: Diagnosis not present

## 2021-03-31 DIAGNOSIS — Z833 Family history of diabetes mellitus: Secondary | ICD-10-CM | POA: Diagnosis not present

## 2021-03-31 DIAGNOSIS — Z6832 Body mass index (BMI) 32.0-32.9, adult: Secondary | ICD-10-CM | POA: Diagnosis not present

## 2021-03-31 DIAGNOSIS — N920 Excessive and frequent menstruation with regular cycle: Secondary | ICD-10-CM | POA: Insufficient documentation

## 2021-03-31 DIAGNOSIS — M79602 Pain in left arm: Secondary | ICD-10-CM | POA: Insufficient documentation

## 2021-03-31 DIAGNOSIS — Z86718 Personal history of other venous thrombosis and embolism: Secondary | ICD-10-CM | POA: Insufficient documentation

## 2021-03-31 LAB — ANTITHROMBIN III: AntiThromb III Func: 115 % (ref 75–120)

## 2021-03-31 NOTE — Assessment & Plan Note (Signed)
This is a very pleasant 30 year old female patient with past medical history significant for left upper extremity DVT referred to hematology for evaluation and anticoagulation recommendations.  Anne Kirk arrived to the appointment today by herself.  She was on birth control at the time of her left upper extremity DVT.  She also is physically active and lifts heavy weights regularly.  She has never had any DVT/PE prior to this event, no peripartum or postpartum complications, no miscarriages and no known family history of hypercoagulable disorders. Physical examination today, asymmetrical left upper extremity swelling with congestion of veins noted.  Pulses strong. I have reviewed with patient that upper extremity DVTs are relatively uncommon especially when there is spontaneous.  This could indeed be provoked by the birth control but I wonder if she has any underlying thoracic outlet syndrome or  which is causing some compression on the subclavian vein which gets exaggerated with physical activity and additional hypercoagulable risk factors.  I do agree with hypercoagulable work-up today.  If she has no evidence of hypercoagulable disorders, then she may have to return back to vascular surgery for further investigation about thoracic outlet syndrome.  She understands that with thoracic outlet syndrome there is a risk of reclotting given the anatomical changes.  She has already discontinued the birth control.  I have recommended that she avoid estrogen-based contraception for the future. She will return to clinic in a couple weeks to review results and to discuss additional recommendations.  She was recommended to continue Xarelto in the interim.

## 2021-03-31 NOTE — Progress Notes (Signed)
[facility] CONSULT NOTE  Patient Care Team: [name], NP as PCP - General (Nurse Practitioner)  CHIEF COMPLAINTS/PURPOSE OF CONSULTATION:  Upper arm extremity DVT  ASSESSMENT & PLAN:  Acute deep vein thrombosis (DVT) of axillary vein of left upper extremity (HCC) This is a very pleasant 30 year old female patient with past medical history significant for left upper extremity DVT referred to hematology for evaluation and anticoagulation recommendations.  [name] arrived to the appointment today by herself.  She was on birth control at the time of her left upper extremity DVT.  She also is physically active and lifts heavy weights regularly.  She has never had any DVT/PE prior to this event, no peripartum or postpartum complications, no miscarriages and no known family history of hypercoagulable disorders. Physical examination today, asymmetrical left upper extremity swelling with congestion of veins noted.  Pulses strong. I have reviewed with patient that upper extremity DVTs are relatively uncommon especially when there is spontaneous.  This could indeed be provoked by the birth control but I wonder if she has any underlying thoracic outlet syndrome or  which is causing some compression on the subclavian vein which gets exaggerated with physical activity and additional hypercoagulable risk factors.  I do agree with hypercoagulable work-up today.  If she has no evidence of hypercoagulable disorders, then she may have to return back to vascular surgery for further investigation about thoracic outlet syndrome.  She understands that with thoracic outlet syndrome there is a risk of reclotting given the anatomical changes.  She has already discontinued the birth control.  I have recommended that she avoid estrogen-based contraception for the future. She will return to clinic in a couple weeks to review results and to discuss additional recommendations.  She was recommended to  continue Xarelto in the interim.   Orders Placed This Encounter  Procedures  . Lupus anticoagulant panel    Standing Status:   Future    Number of Occurrences:   1    Standing Expiration Date:   03/31/2022  . Prothrombin gene mutation    Standing Status:   Future    Number of Occurrences:   1    Standing Expiration Date:   03/31/2022  . Factor 5 leiden    Standing Status:   Future    Number of Occurrences:   1    Standing Expiration Date:   03/31/2022  . Hexagonal Phospholipid Neutralization    Standing Status:   Future    Number of Occurrences:   1    Standing Expiration Date:   03/31/2022  . Cardiolipin antibodies, IgG, IgM, IgA    Standing Status:   Future    Number of Occurrences:   1    Standing Expiration Date:   03/31/2022  . Antithrombin III    Standing Status:   Future    Number of Occurrences:   1    Standing Expiration Date:   03/31/2022  . Protein C activity    Standing Status:   Future    Number of Occurrences:   1    Standing Expiration Date:   03/31/2022  . Protein S activity    Standing Status:   Future    Number of Occurrences:   1    Standing Expiration Date:   03/31/2022  . Beta-2-glycoprotein i abs, IgG/M/A    Standing Status:   Future    Number of Occurrences:   1    Standing Expiration Date:  03/31/2022    Thank you for consulting us in the care of this patient.  Please not hesitate to contact us with any additional questions or concerns  HISTORY OF PRESENTING ILLNESS:   Anne Kirk 30 y.o. female is here because of UE DVT.  This is a very pleasant 30 year old female patient with past medical history significant for left upper extremity DVT of axillary vein, subclavian vein while on birth control referred to hematology for anticoagulation recommendations.  Ms. Anne is a young healthy female patient with no significant past medical history.  She was recently started on birth control for management of menorrhagia and had to have an increase in her birth  control dose.  Soon after she noticed some mild left upper extremity pain which was initially thought to be cervical sprain radiating to the but this did not get better on muscle relaxants hence she went back to the ER when she noticed a change in the color of her arm and severe swelling of her left arm.  She is physically very active and lifts heavy weights regularly.   She has been followed up by the vascular specialist who referred her to hematology.  She has been taking Xarelto for about 3 months and she has been tolerating it very well.  Her left arm is still asymmetrical but improved swelling and improved venous congestion.  No tingling or numbness in her left temperature of the left.  Prior to this she never had any thromboembolic events.  She has 1 child who is 31 years old and no miscarriages.  No family history of hypercoagulable disorders.  No known clavicular fractures Rest of the pertinent 10 point ROS reviewed and negative.  REVIEW OF SYSTEMS:   Constitutional: Denies fevers, chills or abnormal night sweats Eyes: Denies blurriness of vision, double vision or watery eyes Ears, nose, mouth, throat, and face: Denies mucositis or sore throat Respiratory: Denies cough, dyspnea or wheezes Cardiovascular: Denies palpitation, chest discomfort or lower extremity swelling Gastrointestinal:  Denies nausea, heartburn or change in bowel habits Skin: Denies abnormal skin rashes Lymphatics: Denies new lymphadenopathy or easy bruising Neurological:Denies numbness, tingling or new weaknesses Behavioral/Psych: Mood is stable, no new changes  All other systems were reviewed with the patient and are negative.  MEDICAL HISTORY:  Past Medical History:  Diagnosis Date  . DVT (deep venous thrombosis) (HCC)     SURGICAL HISTORY: Past Surgical History:  Procedure Laterality Date  . PERIPHERAL VASCULAR THROMBECTOMY N/A 12/21/2020   Procedure: PERIPHERAL VASCULAR THROMBECTOMY;  Surgeon: Nada LibmanBrabham, Vance  W, MD;  Location: [facility] INVASIVE [facility];  Service: Cardiovascular;  Laterality: N/A;  . UPPER EXTREMITY VENOGRAPHY Left 12/21/2020   Procedure: UPPER EXTREMITY VENOGRAPHY;  Surgeon: Nada LibmanBrabham, Vance W, MD;  Location: [facility];  Service: Cardiovascular;  Laterality: Left;    SOCIAL HISTORY: Social History   Socioeconomic History  . Marital status: Single    Spouse name: Not on file  . Number of children: Not on file  . Years of education: Not on file  . Highest education level: Not on file  Occupational History  . Not on file  Tobacco Use  . Smoking status: Never Smoker  . Smokeless tobacco: Never Used  Vaping Use  . Vaping Use: Never used  Substance and Sexual Activity  . Alcohol use: No  . Drug use: No  . Sexual activity: Yes    Birth control/protection: None, Condom  Other Topics Concern  . Not on file  Social History  Narrative  . Not on file   Social Determinants of Health   Financial Resource Strain: Not on file  Food Insecurity: Not on file  Transportation Needs: Not on file  Physical Activity: Not on file  Stress: Not on file  Social Connections: Not on file  Intimate Partner Violence: Not At Risk  . Fear of Current or Ex-Partner: No  . Emotionally Abused: No  . Physically Abused: No  . Sexually Abused: No    FAMILY HISTORY: Family History  Problem Relation Age of Onset  . Hypertension Father   . Cancer Maternal Grandmother   . Diabetes Maternal Grandmother   . Cancer Maternal Grandfather   . Heart disease Maternal Grandfather   . Asthma Son   . Eczema Son   . Hypertension Paternal Grandfather   . Heart disease Paternal Grandfather     ALLERGIES:  has No Known Allergies.  MEDICATIONS:  Current Outpatient Medications  Medication Sig Dispense Refill  . acetaminophen (TYLENOL) 325 MG tablet Take 650 mg by mouth every 6 (six) hours as needed.    . rivaroxaban (XARELTO) 20 MG TABS tablet Take 1 tablet (20 mg total) by mouth daily with supper. 30  tablet 6   No current facility-administered medications for this visit.     PHYSICAL EXAMINATION:  ECOG PERFORMANCE STATUS: 0 - Asymptomatic  Vitals:   03/31/21 1037  BP: 110/66  Pulse: 71  Resp: 17  Temp: 98.2 F (36.8 C)  SpO2: 100%   Filed Weights   03/31/21 1037  Weight: 216 lb 6 oz (98.1 kg)    GENERAL:alert, no distress and comfortable SKIN: skin color, texture, turgor are normal, no rashes or significant lesions EYES: normal, conjunctiva are pink and non-injected, sclera clear OROPHARYNX:no exudate, no erythema and lips, buccal mucosa, and tongue normal  NECK: supple, thyroid normal size, non-tender, without nodularity LYMPH:  no palpable lymphadenopathy in the cervical, axillary or inguinal LUNGS: clear to auscultation and percussion with normal breathing effort HEART: regular rate & rhythm and no murmurs and no lower extremity edema ABDOMEN:abdomen soft, non-tender and normal bowel sounds Musculoskeletal:no cyanosis of digits and no clubbing  PSYCH: alert & oriented x 3 with fluent speech NEURO: no focal motor/sensory deficits  LABORATORY DATA:  I have reviewed the data as listed Lab Results  Component Value Date   WBC 15.5 (H) 12/14/2020   HGB 12.6 12/21/2020   HCT 37.0 12/21/2020   MCV 85.9 12/14/2020   PLT 496 (H) 12/14/2020     Chemistry      Component Value Date/Time   NA 138 12/21/2020 0828   K 4.0 12/21/2020 0828   CL 105 12/21/2020 0828   CO2 25 12/14/2020 1447   BUN 6 12/21/2020 0828   CREATININE 0.70 12/21/2020 0828      Component Value Date/Time   CALCIUM 9.2 12/14/2020 1447      RADIOGRAPHIC STUDIES: I have personally reviewed the radiological images as listed and agreed with the findings in the report. VAS [place] UPPER EXTREMITY VENOUS DUPLEX  Result Date: 03/20/2021 UPPER VENOUS STUDY  Indications: Previous deep vein thrombosis in left upper extremity Risk Factors: Surgery 12/21/2020: Left- Thrombectomy and subsequent venoplasty of  the left innominate, subclavian, axillary, and brachial veins. Anticoagulation: Xarelto.  Comparison Study: 12/14/2020: Left- Deep venous thrombosis involving the                   internal jugular vein, subclavian, axillary and brachial  veins. Performing Technologist: Ethelle Lyon  Examination Guidelines: A complete evaluation includes B-mode imaging, spectral Doppler, color Doppler, and power Doppler as needed of all accessible portions of each vessel. Bilateral testing is considered an integral part of a complete examination. Limited examinations for reoccurring indications may be performed as noted.  Right Findings: +----------+------------+---------+-----------+----------+-------+ RIGHT     CompressiblePhasicitySpontaneousPropertiesSummary +----------+------------+---------+-----------+----------+-------+ Subclavian    Full       Yes       Yes                      +----------+------------+---------+-----------+----------+-------+  Left Findings: +----------+------------+---------+-----------+----------+---------------------+ LEFT      CompressiblePhasicitySpontaneousProperties       Summary        +----------+------------+---------+-----------+----------+---------------------+ IJV         Partial      Yes       Yes               Concentric thrombus                                                        with mild, narrow                                                          central patency    +----------+------------+---------+-----------+----------+---------------------+ Subclavian    Full       Yes       Yes                                    +----------+------------+---------+-----------+----------+---------------------+ Axillary      Full       Yes       Yes                                    +----------+------------+---------+-----------+----------+---------------------+ Brachial      Full       Yes       Yes                                     +----------+------------+---------+-----------+----------+---------------------+ Radial        Full       Yes       Yes                                    +----------+------------+---------+-----------+----------+---------------------+ Ulnar         Full       Yes       Yes                                    +----------+------------+---------+-----------+----------+---------------------+ Cephalic      Full                                                        +----------+------------+---------+-----------+----------+---------------------+  Basilic       Full                                                        +----------+------------+---------+-----------+----------+---------------------+  Summary:  Right: No evidence of thrombosis in the subclavian.  Left: No evidence of superficial vein thrombosis in the upper extremity. Findings consistent with age indeterminate, concentric deep vein thrombosis involving the left internal jugular vein. There is mild patency in the central internal jugular vein. There is no evidence of deep vein thrombosis in the remaining deep veins of the left upper extremity.  *See table(s) above for measurements and observations.  Diagnosing physician: Coral Else MD Electronically signed by Coral Else MD on 03/20/2021 at 8:50:13 PM.    Final     All questions were answered. The patient knows to call the clinic with any problems, questions or concerns. I spent 45 minutes in the care of this patient including H and P, review of records, counseling and coordination of care.     Rachel Moulds, MD 03/31/2021 11:21 AM

## 2021-04-01 LAB — LUPUS ANTICOAGULANT PANEL
DRVVT: 29.7 s (ref 0.0–47.0)
PTT Lupus Anticoagulant: 29.7 s (ref 0.0–51.9)

## 2021-04-01 LAB — PROTEIN C ACTIVITY: Protein C Activity: 132 % (ref 73–180)

## 2021-04-01 LAB — PROTEIN S ACTIVITY: Protein S Activity: 32 % — ABNORMAL LOW (ref 63–140)

## 2021-04-03 LAB — BETA-2-GLYCOPROTEIN I ABS, IGG/M/A
Beta-2 Glyco I IgG: <9 GPI IgG units (ref 0–20)
Beta-2-Glycoprotein I IgA: <9 GPI IgA units (ref 0–25)
Beta-2-Glycoprotein I IgM: 15 GPI IgM units (ref 0–32)

## 2021-04-03 LAB — CARDIOLIPIN ANTIBODIES, IGG, IGM, IGA
Anticardiolipin IgA: <9 APL U/mL (ref 0–11)
Anticardiolipin IgG: <9 GPL U/mL (ref 0–14)
Anticardiolipin IgM: <9 MPL U/mL (ref 0–12)

## 2021-04-07 LAB — FACTOR 5 LEIDEN

## 2021-04-10 ENCOUNTER — Inpatient Hospital Stay (HOSPITAL_BASED_OUTPATIENT_CLINIC_OR_DEPARTMENT_OTHER): Payer: 59 | Admitting: Hematology and Oncology

## 2021-04-10 ENCOUNTER — Inpatient Hospital Stay (HOSPITAL_COMMUNITY): Payer: 59

## 2021-04-10 ENCOUNTER — Telehealth: Payer: Self-pay | Admitting: Hematology and Oncology

## 2021-04-10 ENCOUNTER — Other Ambulatory Visit: Payer: Self-pay

## 2021-04-10 VITALS — BP 126/72 | HR 71 | Temp 97.3°F | Resp 20 | Wt 213.4 lb

## 2021-04-10 DIAGNOSIS — I82A12 Acute embolism and thrombosis of left axillary vein: Secondary | ICD-10-CM

## 2021-04-10 DIAGNOSIS — I82622 Acute embolism and thrombosis of deep veins of left upper extremity: Secondary | ICD-10-CM | POA: Diagnosis not present

## 2021-04-10 LAB — PROTHROMBIN GENE MUTATION

## 2021-04-10 NOTE — Telephone Encounter (Signed)
Left message with follow-up appointment per 4/11 schedule message. Gave option to cal back to reschedule if needed.

## 2021-04-10 NOTE — Progress Notes (Signed)
[facility] CONSULT NOTE  Patient Care Team: [name], NP as PCP - General (Nurse Practitioner)  CHIEF COMPLAINTS/PURPOSE OF CONSULTATION:  Upper arm extremity DVT  ASSESSMENT & PLAN:  No problem-specific Assessment & Plan notes found for this encounter.  No orders of the defined types were placed in this encounter.  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  Since last visit, she has noticed less swelling of her left arm and less skin tightness.  She is using compression sleeves.  No other complaints. We have reviewed her hypercoagulable work-up today which showed low protein S activity.  She is currently not on any birth control and has no known family history of protein S deficiency or any liver disease.  Have recommended she proceed with free and total protein S determination.  If she does have evidence of low protein S levels, we will discuss about considering long-term anticoagulation.  She was instructed to continue Xarelto in the interim. She will return to clinic for follow-up in a couple weeks to discuss additional recommendations.  Thank you for consulting [place] in the care of this patient.  Please not hesitate to contact us with any additional questions or concerns  HISTORY OF PRESENTING ILLNESS:   Anne Kirk 30 y.o. female is here because of UE DVT.  This is a very pleasant 30 year old female patient with past medical history referred for left upper extremity DVT of axillary vein, subclavian vein while on birth control referred to hematology for anticoagulation recommendations.  Ms. Anne is a young healthy female patient with no significant past medical history.  She  was recently started on birth control for management of menorrhagia and had to have an increase in her birth control dose.  Soon after she noticed some mild left upper extremity pain which was initially thought to be cervical sprain radiating to the but this did not get better on muscle relaxants hence she went back to the ER when she noticed a change in the color of her arm and severe swelling of her left arm.  She is physically very active and lifts heavy weights regularly.   She has been followed up by the vascular specialist who referred her to hematology.  She has been taking Xarelto for about 3 months and she has been tolerating it very well.  Her left arm is still asymmetrical but improved swelling and improved venous congestion.  No tingling or numbness in her left temperature of the left.  Prior to this she never had any thromboembolic events.  She has 1 child who is 64 years old and no miscarriages.  No family history of hypercoagulable disorders.  No known clavicular fractures  Interval History  Since she last followed up, her arm is less swollen. She is wearing compression sleeves. She denies any complaints for me today. No complications with xarelto.  No chest pain or SOB. She is here to review hypercoagulable work up results  Rest of the pertinent 10 point ROS reviewed and negative.  REVIEW OF SYSTEMS:   Constitutional: Denies fevers, chills or abnormal night sweats Eyes: Denies blurriness of vision, double vision or watery eyes Ears, nose, mouth, throat, and face: Denies mucositis or sore throat Respiratory: Denies cough, dyspnea or wheezes  Cardiovascular: Denies palpitation, chest discomfort or lower extremity swelling Gastrointestinal:  Denies nausea, heartburn or change in bowel habits Skin: Denies abnormal skin rashes Lymphatics: Denies new lymphadenopathy or easy bruising Neurological:Denies numbness, tingling or new weaknesses Behavioral/Psych: Mood is stable, no new  changes  All other systems were reviewed with the patient and are negative.  MEDICAL HISTORY:  Past Medical History:  Diagnosis Date  . DVT (deep venous thrombosis) (HCC)     SURGICAL HISTORY: Past Surgical History:  Procedure Laterality Date  . PERIPHERAL VASCULAR THROMBECTOMY N/A 12/21/2020   Procedure: PERIPHERAL VASCULAR THROMBECTOMY;  Surgeon: Nada Libman, MD;  Location: [facility];  Service: Cardiovascular;  Laterality: N/A;  . UPPER EXTREMITY VENOGRAPHY Left 12/21/2020   Procedure: UPPER EXTREMITY VENOGRAPHY;  Surgeon: Nada Libman, MD;  Location: [facility];  Service: Cardiovascular;  Laterality: Left;    SOCIAL HISTORY: Social History   Socioeconomic History  . Marital status: Single    Spouse name: Not on file  . Number of children: Not on file  . Years of education: Not on file  . Highest education level: Not on file  Occupational History  . Not on file  Tobacco Use  . Smoking status: Never Smoker  . Smokeless tobacco: Never Used  Vaping Use  . Vaping Use: Never used  Substance and Sexual Activity  . Alcohol use: No  . Drug use: No  . Sexual activity: Yes    Birth control/protection: None, Condom  Other Topics Concern  . Not on file  Social History Narrative  . Not on file   Social Determinants of Health   Financial Resource Strain: Not on file  Food Insecurity: Not on file  Transportation Needs: Not on file  Physical Activity: Not on file  Stress: Not on file  Social Connections: Not on file  Intimate Partner Violence: Not At Risk  . Fear of Current or Ex-Partner: No  . Emotionally Abused: No  . Physically Abused: No  . Sexually Abused: No    FAMILY HISTORY: Family History  Problem Relation Age of Onset  . Hypertension Father   . Cancer Maternal Grandmother   . Diabetes Maternal Grandmother   . Cancer Maternal Grandfather   . Heart disease Maternal Grandfather   . Asthma Son   . Eczema Son   . Hypertension  Paternal Grandfather   . Heart disease Paternal Grandfather     ALLERGIES:  has No Known Allergies.  MEDICATIONS:  Current Outpatient Medications  Medication Sig Dispense Refill  . acetaminophen (TYLENOL) 325 MG tablet Take 650 mg by mouth every 6 (six) hours as needed.    . rivaroxaban (XARELTO) 20 MG TABS tablet Take 1 tablet (20 mg total) by mouth daily with supper. 30 tablet 6   No current facility-administered medications for this visit.     PHYSICAL EXAMINATION:  ECOG PERFORMANCE STATUS: 0 - Asymptomatic  Vitals:   04/10/21 1447  BP: 126/72  Pulse: 71  Resp: 20  Temp: (!) 97.3 F (36.3 C)  SpO2: 98%   Filed Weights   04/10/21 1447  Weight: 213 lb 6.4 oz (96.8 kg)    GENERAL:alert, no distress and comfortable Arm: Left arm with prominent veins, less swollen compared to last visit.  LABORATORY DATA:  I have reviewed the data as listed Lab Results  Component Value Date   WBC 15.5 (H) 12/14/2020   HGB 12.6 12/21/2020   HCT 37.0 12/21/2020   MCV 85.9 12/14/2020   PLT  496 (H) 12/14/2020     Chemistry      Component Value Date/Time   NA 138 12/21/2020 0828   K 4.0 12/21/2020 0828   CL 105 12/21/2020 0828   CO2 25 12/14/2020 1447   BUN 6 12/21/2020 0828   CREATININE 0.70 12/21/2020 0828      Component Value Date/Time   CALCIUM 9.2 12/14/2020 1447      RADIOGRAPHIC STUDIES: I have personally reviewed the radiological images as listed and agreed with the findings in the report. VAS US UPPER EXTREMITY VENOUS DUPLEX  Result Date: 03/20/2021 UPPER VENOUS STUDY  Indications: Previous deep vein thrombosis in left upper extremity Risk Factors: Surgery 12/21/2020: Left- Thrombectomy and subsequent venoplasty of the left innominate, subclavian, axillary, and brachial veins. Anticoagulation: Xarelto.  Comparison Study: 12/14/2020: Left- Deep venous thrombosis involving the                   internal jugular vein, subclavian, axillary and brachial                    veins. Performing Technologist: Ethelle LyonMegan Krolak  Examination Guidelines: A complete evaluation includes B-mode imaging, spectral Doppler, color Doppler, and power Doppler as needed of all accessible portions of each vessel. Bilateral testing is considered an integral part of a complete examination. Limited examinations for reoccurring indications may be performed as noted.  Right Findings: +----------+------------+---------+-----------+----------+-------+ RIGHT     CompressiblePhasicitySpontaneousPropertiesSummary +----------+------------+---------+-----------+----------+-------+ Subclavian    Full       Yes       Yes                      +----------+------------+---------+-----------+----------+-------+  Left Findings: +----------+------------+---------+-----------+----------+---------------------+ LEFT      CompressiblePhasicitySpontaneousProperties       Summary        +----------+------------+---------+-----------+----------+---------------------+ IJV         Partial      Yes       Yes               Concentric thrombus                                                        with mild, narrow                                                          central patency    +----------+------------+---------+-----------+----------+---------------------+ Subclavian    Full       Yes       Yes                                    +----------+------------+---------+-----------+----------+---------------------+ Axillary      Full       Yes       Yes                                    +----------+------------+---------+-----------+----------+---------------------+ Brachial      Full  Yes       Yes                                    +----------+------------+---------+-----------+----------+---------------------+ Radial        Full       Yes       Yes                                     +----------+------------+---------+-----------+----------+---------------------+ Ulnar         Full       Yes       Yes                                    +----------+------------+---------+-----------+----------+---------------------+ Cephalic      Full                                                        +----------+------------+---------+-----------+----------+---------------------+ Basilic       Full                                                        +----------+------------+---------+-----------+----------+---------------------+  Summary:  Right: No evidence of thrombosis in the subclavian.  Left: No evidence of superficial vein thrombosis in the upper extremity. Findings consistent with age indeterminate, concentric deep vein thrombosis involving the left internal jugular vein. There is mild patency in the central internal jugular vein. There is no evidence of deep vein thrombosis in the remaining deep veins of the left upper extremity.  *See table(s) above for measurements and observations.  Diagnosing physician: Coral Else MD Electronically signed by Coral Else MD on 03/20/2021 at 8:50:13 PM.    Final     All questions were answered. The patient knows to call the clinic with any problems, questions or concerns. I spent in the care of this patient including H and P, review of records, counseling and coordination of care.     Rachel Moulds, MD 04/10/2021 3:09 PM

## 2021-04-11 ENCOUNTER — Encounter (HOSPITAL_COMMUNITY): Payer: Self-pay | Admitting: Hematology and Oncology

## 2021-04-11 LAB — PROTEIN S, TOTAL AND FREE
Protein S Ag, Free: 28 % — ABNORMAL LOW (ref 61–136)
Protein S Ag, Total: 51 % — ABNORMAL LOW (ref 60–150)

## 2021-04-11 LAB — HEXAGONAL PHOSPHOLIPID NEUTRALIZATION: Hexagonal Phospholipid Neutral: 0 s

## 2021-04-25 ENCOUNTER — Encounter: Payer: Self-pay | Admitting: Hematology and Oncology

## 2021-04-25 ENCOUNTER — Inpatient Hospital Stay: Payer: 59 | Attending: Hematology and Oncology | Admitting: Hematology and Oncology

## 2021-04-25 DIAGNOSIS — D6859 Other primary thrombophilia: Secondary | ICD-10-CM | POA: Diagnosis not present

## 2021-04-25 NOTE — Assessment & Plan Note (Signed)
  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   She is here to follow-up on her hypercoagulable work-up results.  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  Given her DVT while on oral contraceptive pills, this is considered provoked DVT.  In patients with protein S deficiency and provoked DVT, there is no strong consensus to continue indefinite anticoagulation in all circumstances.  Hence I have recommended that she avoid any form of estrogen supplementation or oral contraceptive therapy in the future.  She can continue current blood thinners for about 6 months and then monitor.  We have discussed about risk factors for DVT/PE.  If she were to become pregnant, she needs to contact us right away, we may consider prophylactic anticoagulation during pregnancy and immediate postpartum. She understands to go to the nearest hospital with any worsening health complaints suggestive of recurrent DVT/PE which we have discussed today. Thank you for consulting [place] in the care of this patient.  Please not hesitate to contact us with any additional questions or concerns

## 2021-04-25 NOTE — Progress Notes (Signed)
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   She is here to follow-up on her hypercoagulable work-up results.  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  Given her DVT while on oral contraceptive pills, this is considered provoked DVT.  In patients with protein S deficiency and provoked DVT, there is no strong consensus to continue indefinite anticoagulation in all circumstances.  Hence I have recommended that she avoid any form of estrogen supplementation or oral contraceptive therapy in the future.  She can continue current blood thinners for about 6 months and then monitor.  We have discussed about risk factors for DVT/PE.  If she were to become pregnant, she needs to contact us right away, we may consider prophylactic anticoagulation during pregnancy and immediate postpartum. She understands to go to the nearest hospital with any worsening health complaints suggestive of recurrent DVT/PE which we have discussed today. Thank you for consulting [place] in the care of this patient.  Please not hesitate to contact us with any additional questions or concerns   No orders of the defined types were placed in this encounter.  HISTORY OF PRESENTING ILLNESS:   [place] Scardino 30 y.o. female is here because of UE DVT.  This is a very pleasant 30 year old female patient with past medical history referred for left upper extremity DVT of axillary vein,  subclavian vein while on birth control referred to hematology for anticoagulation recommendations   Interval History  Since she last followed up, she denies any new complaints. No complications with xarelto.  No chest pain or SOB. She is here to review hypercoagulable work up results via telephone.  Rest of the pertinent 10 point ROS reviewed and negative.  REVIEW OF SYSTEMS:   Constitutional: Denies fevers, chills or abnormal night sweats Eyes: Denies blurriness of vision, double vision or watery eyes Ears, nose, mouth, throat, and face: Denies mucositis or sore throat Respiratory: Denies cough, dyspnea or wheezes Cardiovascular: Denies palpitation, chest discomfort or lower extremity swelling Gastrointestinal:  Denies nausea, heartburn or change in bowel habits Skin: Denies abnormal skin rashes Lymphatics: Denies new lymphadenopathy or easy bruising Neurological:Denies numbness, tingling or new weaknesses Behavioral/Psych: Mood is stable, no new changes  All other systems were reviewed with the patient and are negative.  MEDICAL HISTORY:  Past Medical History:  Diagnosis Date  . DVT (deep venous thrombosis) (HCC)     SURGICAL HISTORY: Past Surgical History:  Procedure Laterality Date  . PERIPHERAL VASCULAR THROMBECTOMY N/A 12/21/2020   Procedure: PERIPHERAL VASCULAR THROMBECTOMY;  Surgeon: Nada Libman, MD;  Location: [facility];  Service: Cardiovascular;  Laterality: N/A;  . UPPER EXTREMITY VENOGRAPHY Left 12/21/2020   Procedure: UPPER EXTREMITY VENOGRAPHY;  Surgeon: Nada Libman, MD;  Location: [facility];  Service: Cardiovascular;  Laterality: Left;    SOCIAL HISTORY: Social History   Socioeconomic History  . Marital status: Single    Spouse name: Not on file  . Number of children: Not on file  . Years of  education: Not on file  . Highest education level: Not on file  Occupational History  . Not on file  Tobacco Use  . Smoking status:  Never Smoker  . Smokeless tobacco: Never Used  Vaping Use  . Vaping Use: Never used  Substance and Sexual Activity  . Alcohol use: No  . Drug use: No  . Sexual activity: Yes    Birth control/protection: None, Condom  Other Topics Concern  . Not on file  Social History Narrative  . Not on file   Social Determinants of Health   Financial Resource Strain: Not on file  Food Insecurity: Not on file  Transportation Needs: Not on file  Physical Activity: Not on file  Stress: Not on file  Social Connections: Not on file  Intimate Partner Violence: Not At Risk  . Fear of Current or Ex-Partner: No  . Emotionally Abused: No  . Physically Abused: No  . Sexually Abused: No    FAMILY HISTORY: Family History  Problem Relation Age of Onset  . Hypertension Father   . Cancer Maternal Grandmother   . Diabetes Maternal Grandmother   . Cancer Maternal Grandfather   . Heart disease Maternal Grandfather   . Asthma Son   . Eczema Son   . Hypertension Paternal Grandfather   . Heart disease Paternal Grandfather     ALLERGIES:  has No Known Allergies.  MEDICATIONS:  Current Outpatient Medications  Medication Sig Dispense Refill  . acetaminophen (TYLENOL) 325 MG tablet Take 650 mg by mouth every 6 (six) hours as needed.    . rivaroxaban (XARELTO) 20 MG TABS tablet Take 1 tablet (20 mg total) by mouth daily with supper. 30 tablet 6   No current facility-administered medications for this visit.     PHYSICAL EXAMINATION:  ECOG PERFORMANCE STATUS: 0 - Asymptomatic  There were no vitals filed for this visit. There were no vitals filed for this visit.  Physical exam deferred, telephone visit LABORATORY DATA:  I have reviewed the data as listed Lab Results  Component Value Date   WBC 15.5 (H) 12/14/2020   HGB 12.6 12/21/2020   HCT 37.0 12/21/2020   MCV 85.9 12/14/2020   PLT 496 (H) 12/14/2020     Chemistry      Component Value Date/Time   NA 138 12/21/2020 0828   K 4.0  12/21/2020 0828   CL 105 12/21/2020 0828   CO2 25 12/14/2020 1447   BUN 6 12/21/2020 0828   CREATININE 0.70 12/21/2020 0828      Component Value Date/Time   CALCIUM 9.2 12/14/2020 1447     We have reviewed the hypercoagulable work-up results and most recent labs which suggest type I protein S deficiency.  RADIOGRAPHIC STUDIES: I have personally reviewed the radiological images as listed and agreed with the findings in the report.  No results found.  All questions were answered. The patient knows to call the clinic with any problems, questions or concerns.  I spent 15 minutes in the care of this patient including history, review of protein S results, discussion about protein S deficiency, risk factors for recurrence, duration of anticoagulation and increased risk of DVT/PE with a high estrogen state.  I connected with  [place] Coffelt on 04/25/21 by a telephone application and verified that I am speaking with the correct person using two identifiers.   I discussed the limitations of evaluation and management by telemedicine. The patient expressed understanding and agreed to proceed.     Rachel Moulds, MD  04/25/2021 12:58 PM

## 2021-04-27 ENCOUNTER — Telehealth: Payer: Self-pay | Admitting: Hematology and Oncology

## 2021-04-27 NOTE — Telephone Encounter (Signed)
Scheduled appt per 4/26 sch msg. Mailed updated calendar to pt.

## 2022-01-10 IMAGING — DX DG HUMERUS 2V *L*
2 series · 2 of 2 positions shown · non-contrast
Comparison: None.

CLINICAL DATA: Pain and swelling

EXAM:
LEFT HUMERUS - 2+ VIEW

[humerus lat]
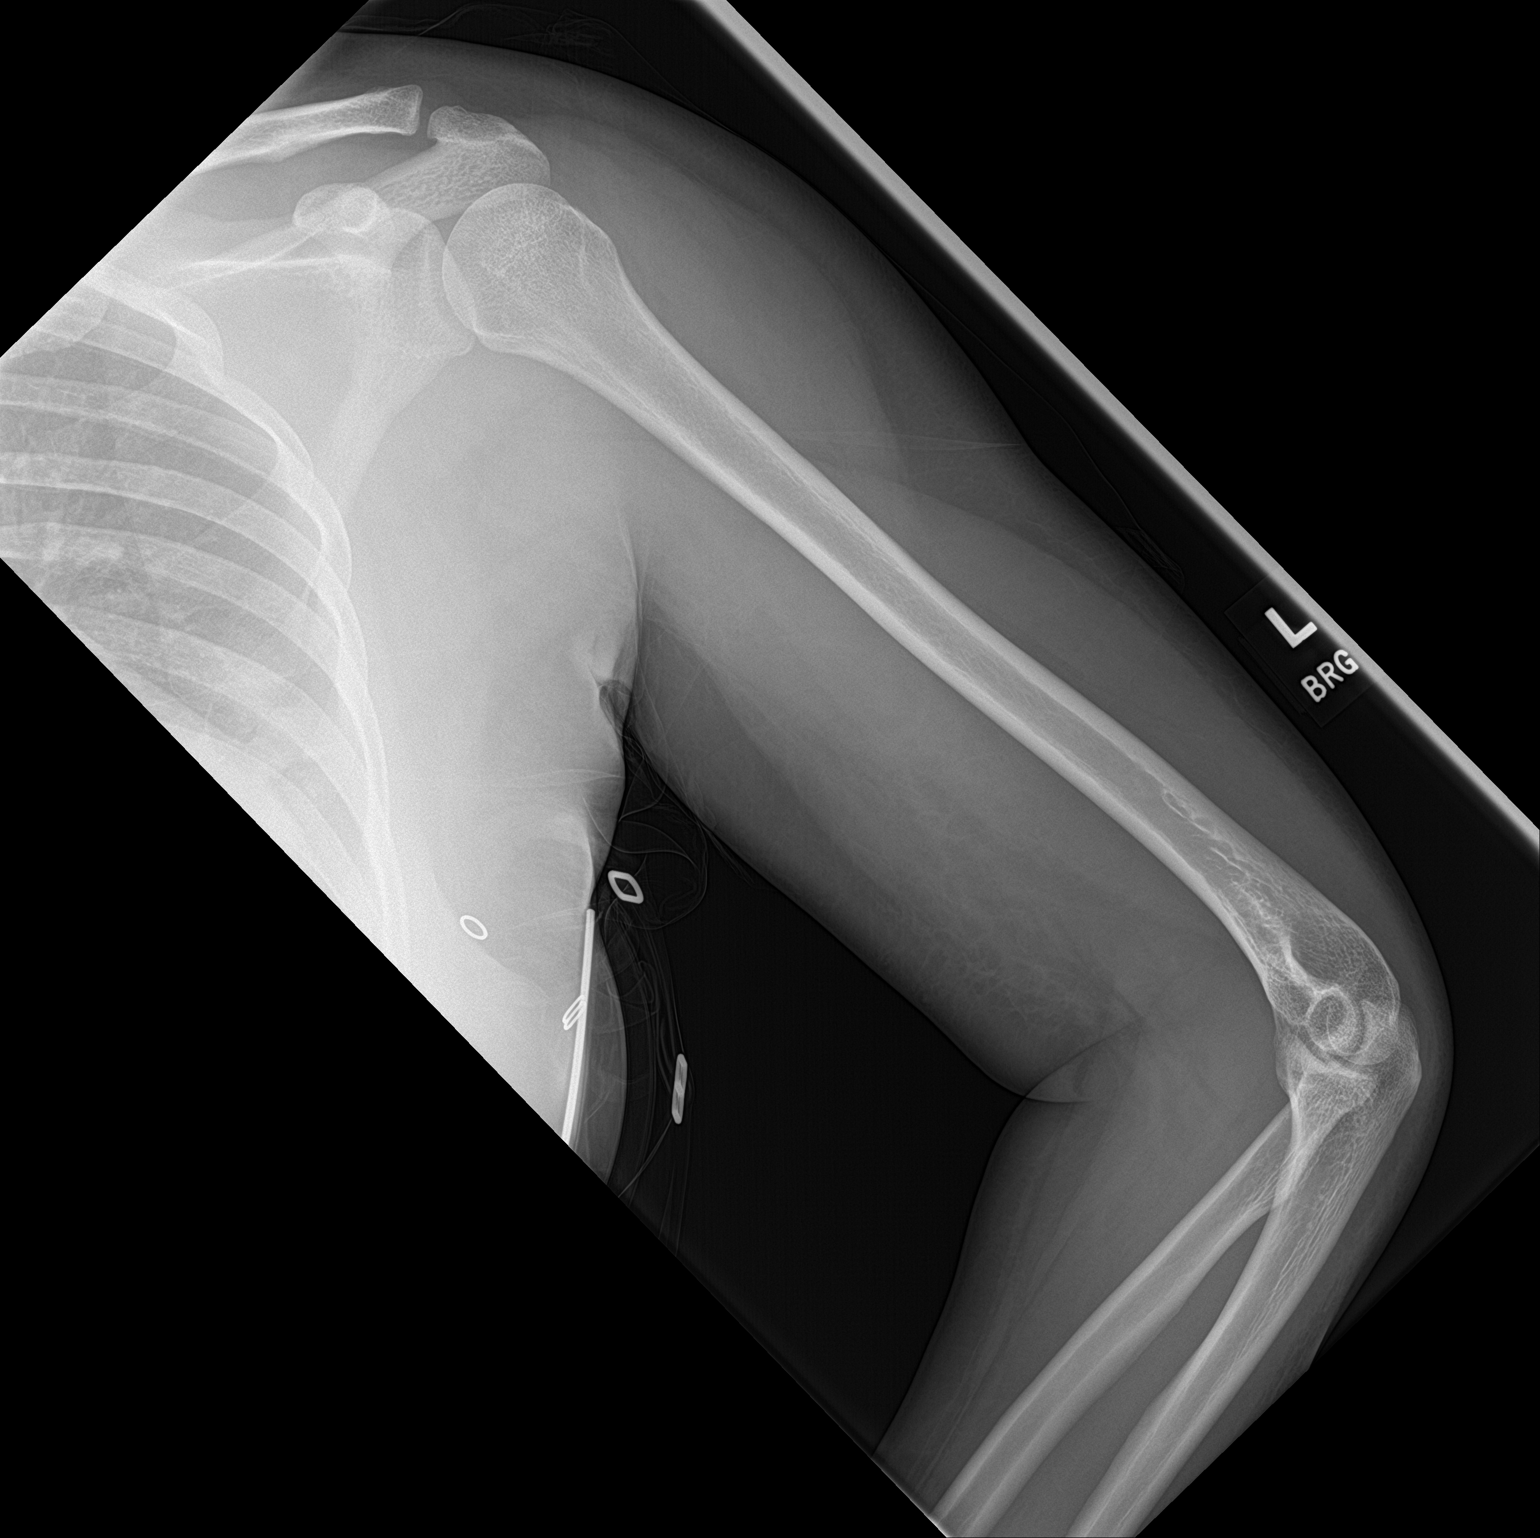

[humerus ap]
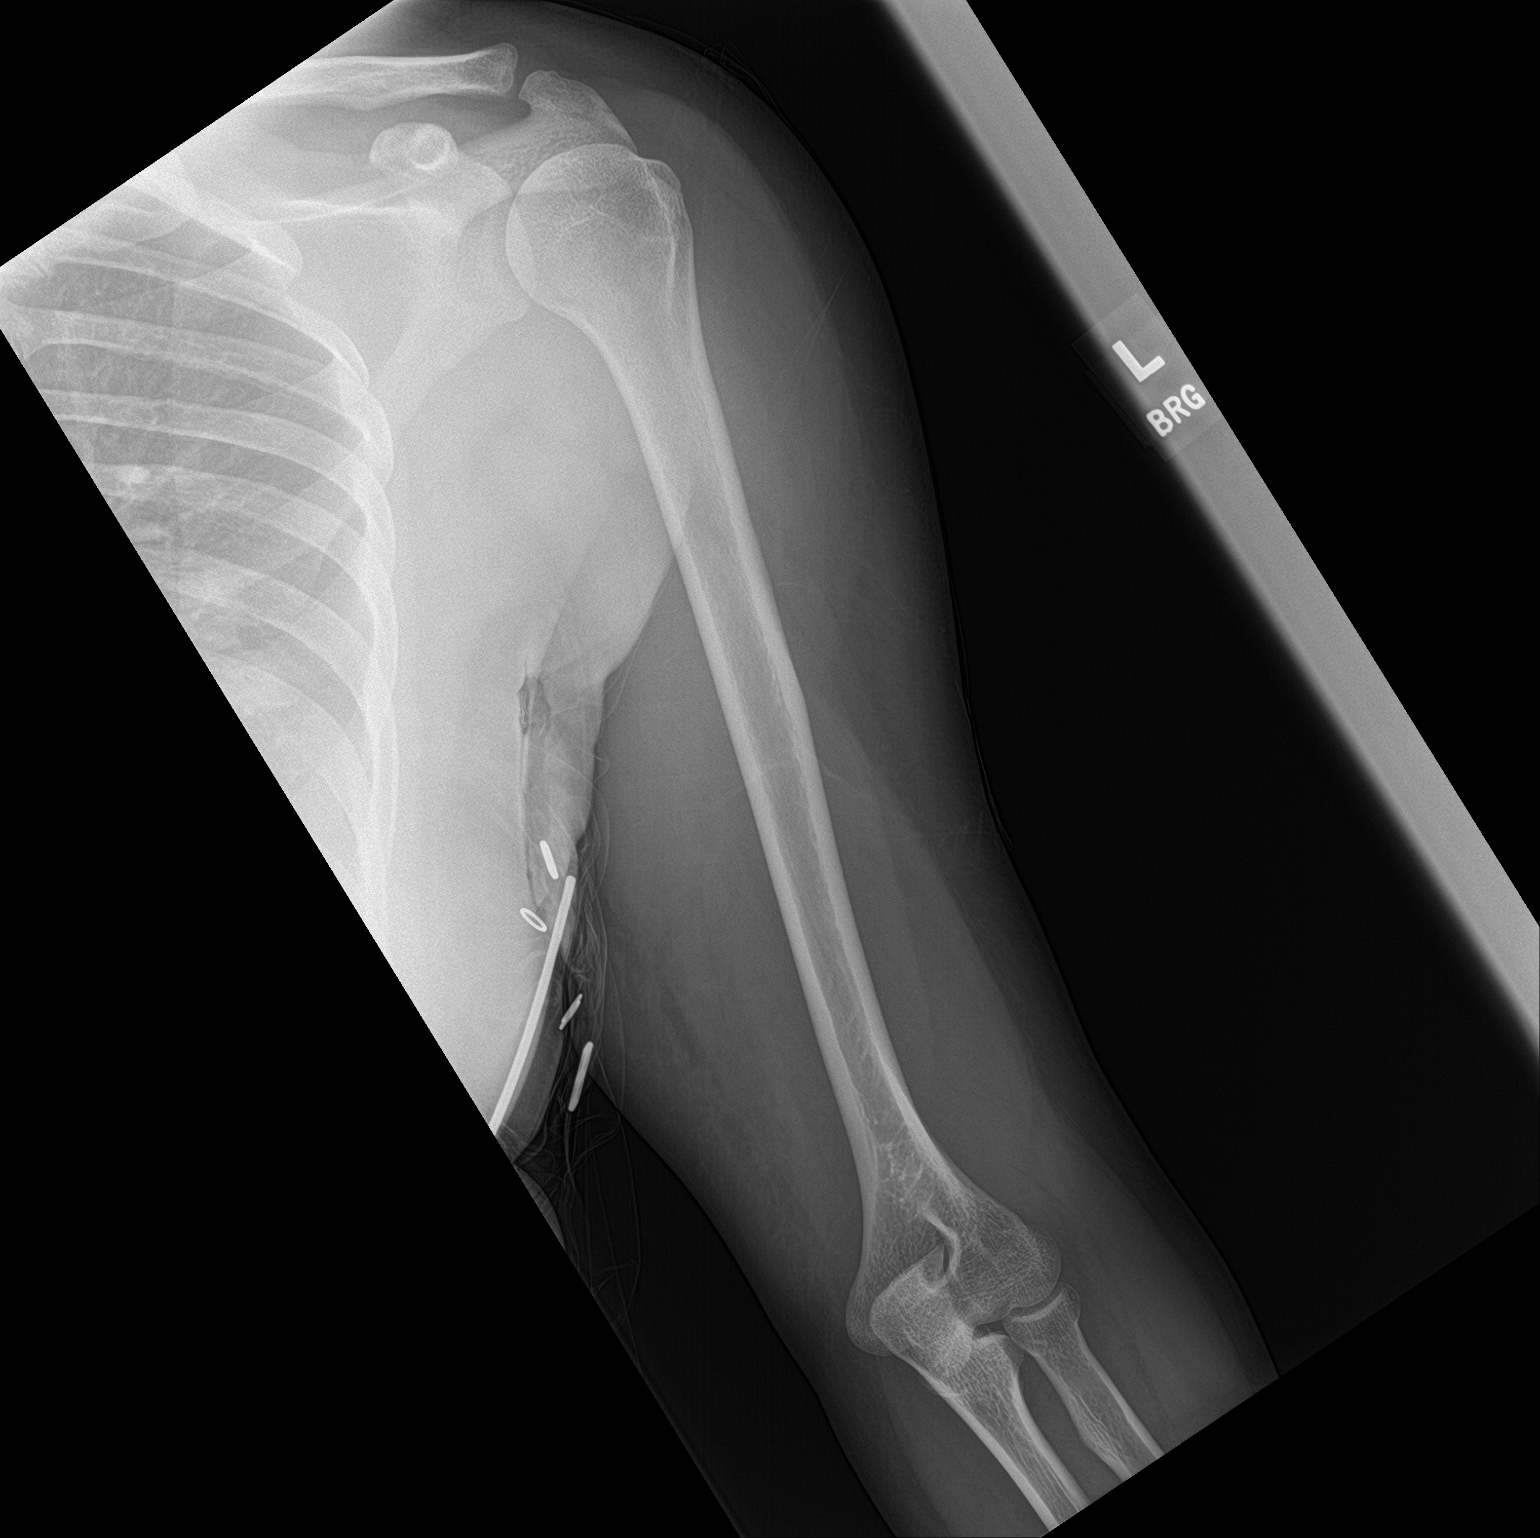

[2 of 2 positions shown; findings below may reference images not displayed]

FINDINGS: Frontal and lateral views were obtained. No fracture or dislocation.
No abnormal periosteal reaction. Joint spaces appear normal. No soft
tissue air.
IMPRESSION: No fracture or dislocation.  No evident arthropathy.

## 2022-01-10 IMAGING — DX DG FOREARM 2V*L*
2 series · 2 of 2 positions shown · non-contrast
Comparison: None.

CLINICAL DATA: Pain and soft tissue swelling

EXAM:
LEFT FOREARM - 2 VIEW

[forearm ap]
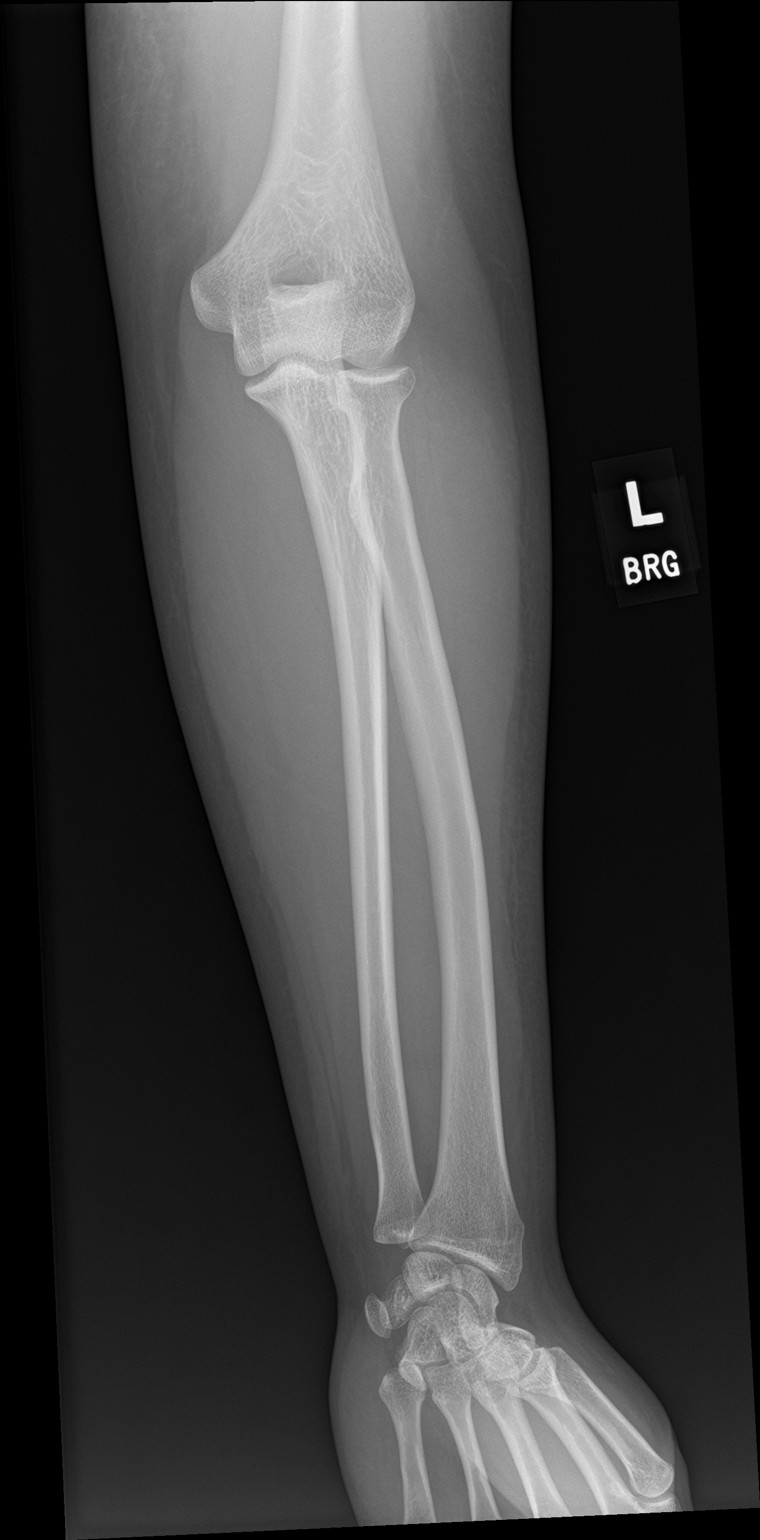

[forearm lat]
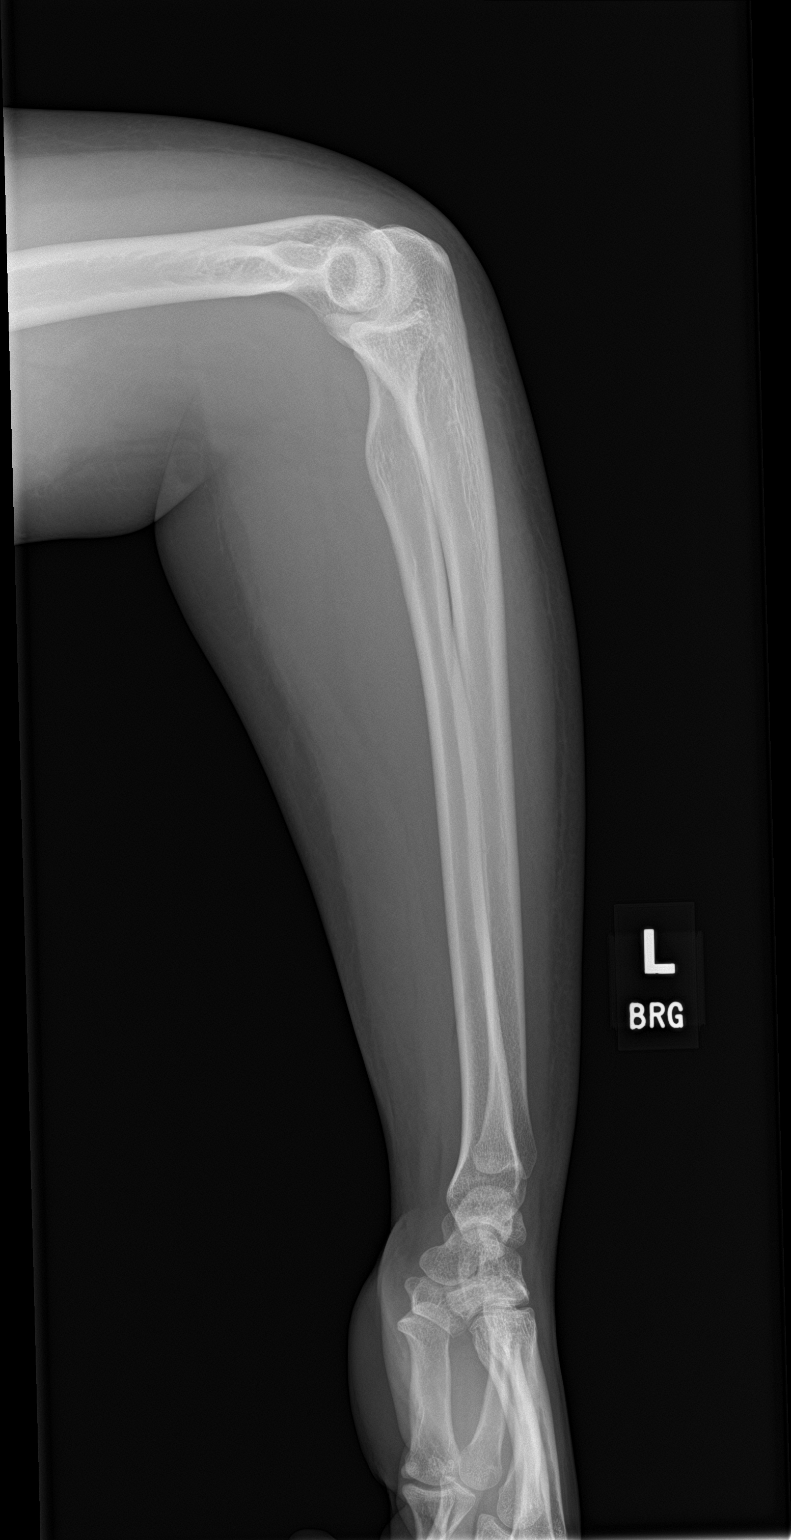

[2 of 2 positions shown; findings below may reference images not displayed]

FINDINGS: Frontal and lateral views were obtained. No fracture or dislocation.
Joint spaces appear normal. No erosive change. There is a minus
ulnar variance.
IMPRESSION: No fracture or dislocation. No evident arthropathy. Minus ulnar
variance.

## 2022-03-05 ENCOUNTER — Telehealth: Payer: Self-pay | Admitting: Hematology and Oncology

## 2022-03-05 NOTE — Telephone Encounter (Signed)
Rescheduled appointments per provider's template. Left message.  ?

## 2022-04-25 ENCOUNTER — Inpatient Hospital Stay: Payer: 59 | Attending: Hematology and Oncology | Admitting: Hematology and Oncology

## 2022-04-25 ENCOUNTER — Ambulatory Visit: Payer: 59 | Admitting: Hematology and Oncology

## 2022-04-25 NOTE — Progress Notes (Deleted)
[facility] CONSULT NOTE  Patient Care Team: [name], NP as PCP - General (Nurse Practitioner)  CHIEF COMPLAINTS/PURPOSE OF CONSULTATION:  Upper arm extremity DVT  ASSESSMENT & PLAN:  No problem-specific Assessment & Plan notes found for this encounter.  No orders of the defined types were placed in this encounter.  HISTORY OF PRESENTING ILLNESS:   Anne Kirk 31 y.o. female is here because of UE DVT.  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  Since she was on birth control at the time of her DVT and no strong consists to continue indefinite anticoagulation in all circumstances in patients with protein S deficiency, have recommended anticoagulation for 6 months and monitor.  She was clearly advised to avoid any form of estrogen supplementation or oral contraceptive therapy-she was instructed clearly to contact us in case of pregnancy since she may have to start prophylactic anticoagulation.  She is here for follow-up  Interval History  Rest of the pertinent 10 point ROS reviewed and negative.  REVIEW OF SYSTEMS:   Constitutional: Denies fevers, chills or abnormal night sweats Eyes: Denies blurriness of vision, double vision or watery eyes Ears, nose, mouth, throat, and face: Denies mucositis or sore throat Respiratory: Denies cough, dyspnea or wheezes Cardiovascular: Denies palpitation, chest discomfort or lower extremity swelling Gastrointestinal:  Denies nausea, heartburn or change in bowel habits Skin: Denies abnormal skin rashes Lymphatics: Denies new lymphadenopathy or easy bruising Neurological:Denies numbness, tingling or new  weaknesses Behavioral/Psych: Mood is stable, no new changes  All other systems were reviewed with the patient and are negative.  MEDICAL HISTORY:  Past Medical History:  Diagnosis Date   DVT (deep venous thrombosis) (HCC)     SURGICAL HISTORY: Past Surgical History:  Procedure Laterality Date   PERIPHERAL VASCULAR THROMBECTOMY N/A 12/21/2020   Procedure: PERIPHERAL VASCULAR THROMBECTOMY;  Surgeon: Nada Libman, MD;  Location: [facility];  Service: Cardiovascular;  Laterality: N/A;   UPPER EXTREMITY VENOGRAPHY Left 12/21/2020   Procedure: UPPER EXTREMITY VENOGRAPHY;  Surgeon: Nada Libman, MD;  Location: [facility];  Service: Cardiovascular;  Laterality: Left;    SOCIAL HISTORY: Social History   Socioeconomic History   Marital status: Single    Spouse name: Not on file   Number of children: Not on file   Years of education: Not on file   Highest education level: Not on file  Occupational History   Not on file  Tobacco Use   Smoking status: Never   Smokeless tobacco: Never  Vaping Use   Vaping Use: Never used  Substance and Sexual Activity   Alcohol use: No   Drug use: No   Sexual activity: Yes    Birth control/protection: None, Condom  Other Topics Concern   Not on file  Social History Narrative   Not on file   Social Determinants of Health   Financial Resource Strain: Not on file  Food Insecurity: Not on file  Transportation Needs: Not on file  Physical Activity: Not on file  Stress: Not on file  Social Connections: Not on file  Intimate Partner Violence: Not on file    FAMILY HISTORY: Family History  Problem Relation Age of Onset   Hypertension Father  Cancer Maternal Grandmother    Diabetes Maternal Grandmother    Cancer Maternal Grandfather    Heart disease Maternal Grandfather    Asthma Son    Eczema Son    Hypertension Paternal Grandfather    Heart disease Paternal Grandfather     ALLERGIES:  has No Known  Allergies.  MEDICATIONS:  Current Outpatient Medications  Medication Sig Dispense Refill   acetaminophen (TYLENOL) 325 MG tablet Take 650 mg by mouth every 6 (six) hours as needed.     rivaroxaban (XARELTO) 20 MG TABS tablet Take 1 tablet (20 mg total) by mouth daily with supper. 30 tablet 6   No current facility-administered medications for this visit.     PHYSICAL EXAMINATION:  ECOG PERFORMANCE STATUS: 0 - Asymptomatic  There were no vitals filed for this visit. There were no vitals filed for this visit.  Physical exam deferred, telephone visit LABORATORY DATA:  I have reviewed the data as listed Lab Results  Component Value Date   WBC 15.5 (H) 12/14/2020   HGB 12.6 12/21/2020   HCT 37.0 12/21/2020   MCV 85.9 12/14/2020   PLT 496 (H) 12/14/2020     Chemistry      Component Value Date/Time   NA 138 12/21/2020 0828   K 4.0 12/21/2020 0828   CL 105 12/21/2020 0828   CO2 25 12/14/2020 1447   BUN 6 12/21/2020 0828   CREATININE 0.70 12/21/2020 0828      Component Value Date/Time   CALCIUM 9.2 12/14/2020 1447     We have reviewed the hypercoagulable work-up results and most recent labs which suggest type I protein S deficiency.  RADIOGRAPHIC STUDIES: I have personally reviewed the radiological images as listed and agreed with the findings in the report.  No results found.      Rachel Moulds, MD 04/25/2022 8:18 AM
